# Patient Record
Sex: Female | Born: 1935
Health system: Southern US, Community
[De-identification: ages and names within clinical notes are randomized; demographics above are authoritative.]

## PROBLEM LIST (undated history)

## (undated) DIAGNOSIS — I10 Essential (primary) hypertension: Secondary | ICD-10-CM

## (undated) DIAGNOSIS — E119 Type 2 diabetes mellitus without complications: Secondary | ICD-10-CM

## (undated) HISTORY — DX: Essential (primary) hypertension: I10

## (undated) HISTORY — DX: Type 2 diabetes mellitus without complications: E11.9

---

## 2013-07-22 DIAGNOSIS — H6500 Acute serous otitis media, unspecified ear: Secondary | ICD-10-CM | POA: Diagnosis not present

## 2013-09-13 DIAGNOSIS — H31009 Unspecified chorioretinal scars, unspecified eye: Secondary | ICD-10-CM | POA: Diagnosis not present

## 2013-09-13 DIAGNOSIS — H35379 Puckering of macula, unspecified eye: Secondary | ICD-10-CM | POA: Diagnosis not present

## 2013-09-13 DIAGNOSIS — Z961 Presence of intraocular lens: Secondary | ICD-10-CM | POA: Diagnosis not present

## 2013-09-13 DIAGNOSIS — H26499 Other secondary cataract, unspecified eye: Secondary | ICD-10-CM | POA: Diagnosis not present

## 2013-10-21 DIAGNOSIS — H6500 Acute serous otitis media, unspecified ear: Secondary | ICD-10-CM | POA: Diagnosis not present

## 2013-11-01 DIAGNOSIS — E559 Vitamin D deficiency, unspecified: Secondary | ICD-10-CM | POA: Diagnosis not present

## 2013-11-01 DIAGNOSIS — E782 Mixed hyperlipidemia: Secondary | ICD-10-CM | POA: Diagnosis not present

## 2013-11-01 DIAGNOSIS — G4700 Insomnia, unspecified: Secondary | ICD-10-CM | POA: Diagnosis not present

## 2013-11-01 DIAGNOSIS — I10 Essential (primary) hypertension: Secondary | ICD-10-CM | POA: Diagnosis not present

## 2013-11-01 DIAGNOSIS — E119 Type 2 diabetes mellitus without complications: Secondary | ICD-10-CM | POA: Diagnosis not present

## 2013-11-08 DIAGNOSIS — E782 Mixed hyperlipidemia: Secondary | ICD-10-CM | POA: Diagnosis not present

## 2013-11-08 DIAGNOSIS — E119 Type 2 diabetes mellitus without complications: Secondary | ICD-10-CM | POA: Diagnosis not present

## 2013-11-08 DIAGNOSIS — I10 Essential (primary) hypertension: Secondary | ICD-10-CM | POA: Diagnosis not present

## 2013-11-08 DIAGNOSIS — Z0000 Encounter for general adult medical examination without abnormal findings: Secondary | ICD-10-CM | POA: Diagnosis not present

## 2013-11-08 DIAGNOSIS — IMO0002 Reserved for concepts with insufficient information to code with codable children: Secondary | ICD-10-CM | POA: Diagnosis not present

## 2013-11-08 DIAGNOSIS — R1310 Dysphagia, unspecified: Secondary | ICD-10-CM | POA: Diagnosis not present

## 2013-11-13 DIAGNOSIS — Z1212 Encounter for screening for malignant neoplasm of rectum: Secondary | ICD-10-CM | POA: Diagnosis not present

## 2013-11-19 DIAGNOSIS — M899 Disorder of bone, unspecified: Secondary | ICD-10-CM | POA: Diagnosis not present

## 2013-11-19 DIAGNOSIS — Z1231 Encounter for screening mammogram for malignant neoplasm of breast: Secondary | ICD-10-CM | POA: Diagnosis not present

## 2013-11-19 DIAGNOSIS — M810 Age-related osteoporosis without current pathological fracture: Secondary | ICD-10-CM | POA: Diagnosis not present

## 2013-11-19 DIAGNOSIS — M949 Disorder of cartilage, unspecified: Secondary | ICD-10-CM | POA: Diagnosis not present

## 2013-11-29 DIAGNOSIS — R1310 Dysphagia, unspecified: Secondary | ICD-10-CM | POA: Diagnosis not present

## 2013-11-29 DIAGNOSIS — I10 Essential (primary) hypertension: Secondary | ICD-10-CM | POA: Diagnosis not present

## 2013-11-29 DIAGNOSIS — M810 Age-related osteoporosis without current pathological fracture: Secondary | ICD-10-CM | POA: Diagnosis not present

## 2013-11-29 DIAGNOSIS — IMO0002 Reserved for concepts with insufficient information to code with codable children: Secondary | ICD-10-CM | POA: Diagnosis not present

## 2013-11-29 DIAGNOSIS — E119 Type 2 diabetes mellitus without complications: Secondary | ICD-10-CM | POA: Diagnosis not present

## 2013-11-29 DIAGNOSIS — K219 Gastro-esophageal reflux disease without esophagitis: Secondary | ICD-10-CM | POA: Diagnosis not present

## 2014-07-08 DIAGNOSIS — E118 Type 2 diabetes mellitus with unspecified complications: Secondary | ICD-10-CM | POA: Diagnosis not present

## 2014-07-08 DIAGNOSIS — E559 Vitamin D deficiency, unspecified: Secondary | ICD-10-CM | POA: Diagnosis not present

## 2014-07-08 DIAGNOSIS — E1165 Type 2 diabetes mellitus with hyperglycemia: Secondary | ICD-10-CM | POA: Diagnosis not present

## 2014-07-08 DIAGNOSIS — I159 Secondary hypertension, unspecified: Secondary | ICD-10-CM | POA: Diagnosis not present

## 2014-07-21 DIAGNOSIS — J329 Chronic sinusitis, unspecified: Secondary | ICD-10-CM | POA: Diagnosis not present

## 2014-11-07 DIAGNOSIS — E118 Type 2 diabetes mellitus with unspecified complications: Secondary | ICD-10-CM | POA: Diagnosis not present

## 2014-11-07 DIAGNOSIS — E1165 Type 2 diabetes mellitus with hyperglycemia: Secondary | ICD-10-CM | POA: Diagnosis not present

## 2014-11-14 DIAGNOSIS — E1165 Type 2 diabetes mellitus with hyperglycemia: Secondary | ICD-10-CM | POA: Diagnosis not present

## 2014-11-14 DIAGNOSIS — E785 Hyperlipidemia, unspecified: Secondary | ICD-10-CM | POA: Diagnosis not present

## 2014-11-14 DIAGNOSIS — E559 Vitamin D deficiency, unspecified: Secondary | ICD-10-CM | POA: Diagnosis not present

## 2014-11-14 DIAGNOSIS — E118 Type 2 diabetes mellitus with unspecified complications: Secondary | ICD-10-CM | POA: Diagnosis not present

## 2014-11-14 DIAGNOSIS — I10 Essential (primary) hypertension: Secondary | ICD-10-CM | POA: Diagnosis not present

## 2014-12-25 DIAGNOSIS — E1165 Type 2 diabetes mellitus with hyperglycemia: Secondary | ICD-10-CM | POA: Diagnosis not present

## 2014-12-25 DIAGNOSIS — I10 Essential (primary) hypertension: Secondary | ICD-10-CM | POA: Diagnosis not present

## 2014-12-25 DIAGNOSIS — E118 Type 2 diabetes mellitus with unspecified complications: Secondary | ICD-10-CM | POA: Diagnosis not present

## 2014-12-25 DIAGNOSIS — E559 Vitamin D deficiency, unspecified: Secondary | ICD-10-CM | POA: Diagnosis not present

## 2014-12-25 DIAGNOSIS — E785 Hyperlipidemia, unspecified: Secondary | ICD-10-CM | POA: Diagnosis not present

## 2015-02-10 DIAGNOSIS — H9203 Otalgia, bilateral: Secondary | ICD-10-CM | POA: Diagnosis not present

## 2015-02-16 DIAGNOSIS — E785 Hyperlipidemia, unspecified: Secondary | ICD-10-CM | POA: Diagnosis not present

## 2015-02-16 DIAGNOSIS — E118 Type 2 diabetes mellitus with unspecified complications: Secondary | ICD-10-CM | POA: Diagnosis not present

## 2015-02-16 DIAGNOSIS — I10 Essential (primary) hypertension: Secondary | ICD-10-CM | POA: Diagnosis not present

## 2015-02-16 DIAGNOSIS — E1165 Type 2 diabetes mellitus with hyperglycemia: Secondary | ICD-10-CM | POA: Diagnosis not present

## 2015-02-16 DIAGNOSIS — E559 Vitamin D deficiency, unspecified: Secondary | ICD-10-CM | POA: Diagnosis not present

## 2015-04-26 DIAGNOSIS — Z91041 Radiographic dye allergy status: Secondary | ICD-10-CM | POA: Diagnosis not present

## 2015-04-26 DIAGNOSIS — S72402A Unspecified fracture of lower end of left femur, initial encounter for closed fracture: Secondary | ICD-10-CM | POA: Diagnosis not present

## 2015-04-26 DIAGNOSIS — R1313 Dysphagia, pharyngeal phase: Secondary | ICD-10-CM | POA: Diagnosis not present

## 2015-04-26 DIAGNOSIS — R680 Hypothermia, not associated with low environmental temperature: Secondary | ICD-10-CM | POA: Diagnosis present

## 2015-04-26 DIAGNOSIS — N179 Acute kidney failure, unspecified: Secondary | ICD-10-CM | POA: Diagnosis not present

## 2015-04-26 DIAGNOSIS — E1165 Type 2 diabetes mellitus with hyperglycemia: Secondary | ICD-10-CM | POA: Diagnosis present

## 2015-04-26 DIAGNOSIS — E119 Type 2 diabetes mellitus without complications: Secondary | ICD-10-CM | POA: Diagnosis not present

## 2015-04-26 DIAGNOSIS — Z79899 Other long term (current) drug therapy: Secondary | ICD-10-CM | POA: Diagnosis not present

## 2015-04-26 DIAGNOSIS — I10 Essential (primary) hypertension: Secondary | ICD-10-CM | POA: Diagnosis not present

## 2015-04-26 DIAGNOSIS — L89103 Pressure ulcer of unspecified part of back, stage 3: Secondary | ICD-10-CM | POA: Diagnosis not present

## 2015-04-26 DIAGNOSIS — D62 Acute posthemorrhagic anemia: Secondary | ICD-10-CM | POA: Diagnosis not present

## 2015-04-26 DIAGNOSIS — Z885 Allergy status to narcotic agent status: Secondary | ICD-10-CM | POA: Diagnosis not present

## 2015-04-26 DIAGNOSIS — M25562 Pain in left knee: Secondary | ICD-10-CM | POA: Diagnosis not present

## 2015-04-26 DIAGNOSIS — Z9181 History of falling: Secondary | ICD-10-CM | POA: Diagnosis not present

## 2015-04-26 DIAGNOSIS — Z91048 Other nonmedicinal substance allergy status: Secondary | ICD-10-CM | POA: Diagnosis not present

## 2015-04-26 DIAGNOSIS — R7989 Other specified abnormal findings of blood chemistry: Secondary | ICD-10-CM | POA: Diagnosis present

## 2015-04-26 DIAGNOSIS — D649 Anemia, unspecified: Secondary | ICD-10-CM | POA: Diagnosis not present

## 2015-04-26 DIAGNOSIS — Z7982 Long term (current) use of aspirin: Secondary | ICD-10-CM | POA: Diagnosis not present

## 2015-04-26 DIAGNOSIS — M6281 Muscle weakness (generalized): Secondary | ICD-10-CM | POA: Diagnosis not present

## 2015-04-26 DIAGNOSIS — S7290XA Unspecified fracture of unspecified femur, initial encounter for closed fracture: Secondary | ICD-10-CM | POA: Insufficient documentation

## 2015-04-26 DIAGNOSIS — S52301D Unspecified fracture of shaft of right radius, subsequent encounter for closed fracture with routine healing: Secondary | ICD-10-CM | POA: Diagnosis not present

## 2015-04-26 DIAGNOSIS — E782 Mixed hyperlipidemia: Secondary | ICD-10-CM | POA: Diagnosis present

## 2015-04-26 DIAGNOSIS — D72829 Elevated white blood cell count, unspecified: Secondary | ICD-10-CM | POA: Diagnosis present

## 2015-04-26 DIAGNOSIS — T796XXA Traumatic ischemia of muscle, initial encounter: Secondary | ICD-10-CM | POA: Diagnosis not present

## 2015-04-26 DIAGNOSIS — W19XXXA Unspecified fall, initial encounter: Secondary | ICD-10-CM | POA: Diagnosis not present

## 2015-04-26 DIAGNOSIS — E860 Dehydration: Secondary | ICD-10-CM | POA: Diagnosis not present

## 2015-04-26 DIAGNOSIS — M25552 Pain in left hip: Secondary | ICD-10-CM | POA: Diagnosis not present

## 2015-04-26 DIAGNOSIS — S72012A Unspecified intracapsular fracture of left femur, initial encounter for closed fracture: Secondary | ICD-10-CM | POA: Diagnosis not present

## 2015-04-26 DIAGNOSIS — R1310 Dysphagia, unspecified: Secondary | ICD-10-CM | POA: Diagnosis not present

## 2015-04-26 DIAGNOSIS — M6282 Rhabdomyolysis: Secondary | ICD-10-CM | POA: Diagnosis not present

## 2015-04-26 DIAGNOSIS — M810 Age-related osteoporosis without current pathological fracture: Secondary | ICD-10-CM | POA: Diagnosis present

## 2015-04-26 DIAGNOSIS — E440 Moderate protein-calorie malnutrition: Secondary | ICD-10-CM | POA: Diagnosis present

## 2015-04-26 DIAGNOSIS — R079 Chest pain, unspecified: Secondary | ICD-10-CM | POA: Diagnosis not present

## 2015-04-26 DIAGNOSIS — S72402D Unspecified fracture of lower end of left femur, subsequent encounter for closed fracture with routine healing: Secondary | ICD-10-CM | POA: Diagnosis not present

## 2015-04-26 DIAGNOSIS — Z6821 Body mass index (BMI) 21.0-21.9, adult: Secondary | ICD-10-CM | POA: Diagnosis not present

## 2015-04-26 DIAGNOSIS — L89210 Pressure ulcer of right hip, unstageable: Secondary | ICD-10-CM | POA: Diagnosis not present

## 2015-04-26 DIAGNOSIS — S72452A Displaced supracondylar fracture without intracondylar extension of lower end of left femur, initial encounter for closed fracture: Secondary | ICD-10-CM | POA: Diagnosis not present

## 2015-04-26 DIAGNOSIS — Z791 Long term (current) use of non-steroidal anti-inflammatories (NSAID): Secondary | ICD-10-CM | POA: Diagnosis not present

## 2015-04-26 DIAGNOSIS — N171 Acute kidney failure with acute cortical necrosis: Secondary | ICD-10-CM | POA: Diagnosis not present

## 2015-04-26 DIAGNOSIS — R51 Headache: Secondary | ICD-10-CM | POA: Diagnosis not present

## 2015-04-26 DIAGNOSIS — T796XXD Traumatic ischemia of muscle, subsequent encounter: Secondary | ICD-10-CM | POA: Diagnosis not present

## 2015-04-26 DIAGNOSIS — T148 Other injury of unspecified body region: Secondary | ICD-10-CM | POA: Diagnosis not present

## 2015-04-26 DIAGNOSIS — E785 Hyperlipidemia, unspecified: Secondary | ICD-10-CM | POA: Diagnosis not present

## 2015-04-28 DIAGNOSIS — E782 Mixed hyperlipidemia: Secondary | ICD-10-CM | POA: Insufficient documentation

## 2015-04-28 DIAGNOSIS — I10 Essential (primary) hypertension: Secondary | ICD-10-CM | POA: Insufficient documentation

## 2015-04-29 DIAGNOSIS — D62 Acute posthemorrhagic anemia: Secondary | ICD-10-CM | POA: Insufficient documentation

## 2015-04-30 DIAGNOSIS — IMO0001 Reserved for inherently not codable concepts without codable children: Secondary | ICD-10-CM | POA: Insufficient documentation

## 2015-05-01 DIAGNOSIS — S72402D Unspecified fracture of lower end of left femur, subsequent encounter for closed fracture with routine healing: Secondary | ICD-10-CM | POA: Diagnosis not present

## 2015-05-01 DIAGNOSIS — E785 Hyperlipidemia, unspecified: Secondary | ICD-10-CM | POA: Diagnosis not present

## 2015-05-01 DIAGNOSIS — S72462D Displaced supracondylar fracture with intracondylar extension of lower end of left femur, subsequent encounter for closed fracture with routine healing: Secondary | ICD-10-CM | POA: Diagnosis not present

## 2015-05-01 DIAGNOSIS — F329 Major depressive disorder, single episode, unspecified: Secondary | ICD-10-CM | POA: Diagnosis not present

## 2015-05-01 DIAGNOSIS — R2689 Other abnormalities of gait and mobility: Secondary | ICD-10-CM | POA: Diagnosis not present

## 2015-05-01 DIAGNOSIS — R1310 Dysphagia, unspecified: Secondary | ICD-10-CM | POA: Diagnosis not present

## 2015-05-01 DIAGNOSIS — M25562 Pain in left knee: Secondary | ICD-10-CM | POA: Diagnosis not present

## 2015-05-01 DIAGNOSIS — M810 Age-related osteoporosis without current pathological fracture: Secondary | ICD-10-CM | POA: Diagnosis not present

## 2015-05-01 DIAGNOSIS — R1319 Other dysphagia: Secondary | ICD-10-CM | POA: Diagnosis not present

## 2015-05-01 DIAGNOSIS — G4709 Other insomnia: Secondary | ICD-10-CM | POA: Diagnosis not present

## 2015-05-01 DIAGNOSIS — I10 Essential (primary) hypertension: Secondary | ICD-10-CM | POA: Diagnosis not present

## 2015-05-01 DIAGNOSIS — S32309D Unspecified fracture of unspecified ilium, subsequent encounter for fracture with routine healing: Secondary | ICD-10-CM | POA: Diagnosis not present

## 2015-05-01 DIAGNOSIS — D649 Anemia, unspecified: Secondary | ICD-10-CM | POA: Diagnosis not present

## 2015-05-01 DIAGNOSIS — E119 Type 2 diabetes mellitus without complications: Secondary | ICD-10-CM | POA: Diagnosis not present

## 2015-05-01 DIAGNOSIS — E46 Unspecified protein-calorie malnutrition: Secondary | ICD-10-CM | POA: Diagnosis not present

## 2015-05-01 DIAGNOSIS — Z9181 History of falling: Secondary | ICD-10-CM | POA: Diagnosis not present

## 2015-05-01 DIAGNOSIS — M6281 Muscle weakness (generalized): Secondary | ICD-10-CM | POA: Diagnosis not present

## 2015-05-01 DIAGNOSIS — L89100 Pressure ulcer of unspecified part of back, unstageable: Secondary | ICD-10-CM | POA: Diagnosis not present

## 2015-05-01 DIAGNOSIS — L89210 Pressure ulcer of right hip, unstageable: Secondary | ICD-10-CM | POA: Diagnosis not present

## 2015-05-01 DIAGNOSIS — L89103 Pressure ulcer of unspecified part of back, stage 3: Secondary | ICD-10-CM | POA: Diagnosis not present

## 2015-05-01 DIAGNOSIS — M79605 Pain in left leg: Secondary | ICD-10-CM | POA: Diagnosis not present

## 2015-05-01 DIAGNOSIS — F4320 Adjustment disorder, unspecified: Secondary | ICD-10-CM | POA: Diagnosis not present

## 2015-05-01 DIAGNOSIS — M62838 Other muscle spasm: Secondary | ICD-10-CM | POA: Diagnosis not present

## 2015-05-01 DIAGNOSIS — L89204 Pressure ulcer of unspecified hip, stage 4: Secondary | ICD-10-CM | POA: Diagnosis not present

## 2015-05-01 DIAGNOSIS — L89214 Pressure ulcer of right hip, stage 4: Secondary | ICD-10-CM | POA: Diagnosis not present

## 2015-05-04 DIAGNOSIS — M6281 Muscle weakness (generalized): Secondary | ICD-10-CM | POA: Diagnosis not present

## 2015-05-04 DIAGNOSIS — E119 Type 2 diabetes mellitus without complications: Secondary | ICD-10-CM | POA: Diagnosis not present

## 2015-05-04 DIAGNOSIS — R1319 Other dysphagia: Secondary | ICD-10-CM | POA: Diagnosis not present

## 2015-05-04 DIAGNOSIS — I10 Essential (primary) hypertension: Secondary | ICD-10-CM | POA: Diagnosis not present

## 2015-05-04 DIAGNOSIS — R2689 Other abnormalities of gait and mobility: Secondary | ICD-10-CM | POA: Diagnosis not present

## 2015-05-04 DIAGNOSIS — M810 Age-related osteoporosis without current pathological fracture: Secondary | ICD-10-CM | POA: Diagnosis not present

## 2015-05-06 DIAGNOSIS — M79605 Pain in left leg: Secondary | ICD-10-CM | POA: Diagnosis not present

## 2015-05-06 DIAGNOSIS — R2689 Other abnormalities of gait and mobility: Secondary | ICD-10-CM | POA: Diagnosis not present

## 2015-05-11 DIAGNOSIS — R2689 Other abnormalities of gait and mobility: Secondary | ICD-10-CM | POA: Diagnosis not present

## 2015-05-12 DIAGNOSIS — S72453A Displaced supracondylar fracture without intracondylar extension of lower end of unspecified femur, initial encounter for closed fracture: Secondary | ICD-10-CM | POA: Insufficient documentation

## 2015-05-15 DIAGNOSIS — M79605 Pain in left leg: Secondary | ICD-10-CM | POA: Diagnosis not present

## 2015-05-15 DIAGNOSIS — M62838 Other muscle spasm: Secondary | ICD-10-CM | POA: Diagnosis not present

## 2015-05-20 DIAGNOSIS — M79605 Pain in left leg: Secondary | ICD-10-CM | POA: Diagnosis not present

## 2015-05-20 DIAGNOSIS — M25562 Pain in left knee: Secondary | ICD-10-CM | POA: Diagnosis not present

## 2015-06-08 DIAGNOSIS — R2689 Other abnormalities of gait and mobility: Secondary | ICD-10-CM | POA: Diagnosis not present

## 2015-06-08 DIAGNOSIS — M79605 Pain in left leg: Secondary | ICD-10-CM | POA: Diagnosis not present

## 2015-06-10 DIAGNOSIS — E119 Type 2 diabetes mellitus without complications: Secondary | ICD-10-CM | POA: Diagnosis not present

## 2015-06-10 DIAGNOSIS — G4709 Other insomnia: Secondary | ICD-10-CM | POA: Diagnosis not present

## 2015-06-10 DIAGNOSIS — F329 Major depressive disorder, single episode, unspecified: Secondary | ICD-10-CM | POA: Diagnosis not present

## 2015-06-10 DIAGNOSIS — E46 Unspecified protein-calorie malnutrition: Secondary | ICD-10-CM | POA: Diagnosis not present

## 2015-07-06 LAB — CBC AND DIFFERENTIAL
HEMATOCRIT: 34 % — AB (ref 36–46)
HEMOGLOBIN: 10.7 g/dL — AB (ref 12.0–16.0)

## 2015-07-06 LAB — BASIC METABOLIC PANEL: Creatinine: 0.6 mg/dL (ref 0.5–1.1)

## 2015-07-06 LAB — CBC, NO DIFFERENTIAL/PLATELET: MCV: 89.2

## 2015-07-19 DIAGNOSIS — E785 Hyperlipidemia, unspecified: Secondary | ICD-10-CM | POA: Diagnosis not present

## 2015-07-19 DIAGNOSIS — I10 Essential (primary) hypertension: Secondary | ICD-10-CM | POA: Diagnosis not present

## 2015-07-19 DIAGNOSIS — S72462D Displaced supracondylar fracture with intracondylar extension of lower end of left femur, subsequent encounter for closed fracture with routine healing: Secondary | ICD-10-CM | POA: Diagnosis not present

## 2015-07-19 DIAGNOSIS — S72402D Unspecified fracture of lower end of left femur, subsequent encounter for closed fracture with routine healing: Secondary | ICD-10-CM | POA: Diagnosis not present

## 2015-07-19 DIAGNOSIS — R1310 Dysphagia, unspecified: Secondary | ICD-10-CM | POA: Diagnosis not present

## 2015-07-19 DIAGNOSIS — D649 Anemia, unspecified: Secondary | ICD-10-CM | POA: Diagnosis not present

## 2015-07-19 DIAGNOSIS — Z9181 History of falling: Secondary | ICD-10-CM | POA: Diagnosis not present

## 2015-07-19 DIAGNOSIS — L89214 Pressure ulcer of right hip, stage 4: Secondary | ICD-10-CM | POA: Diagnosis not present

## 2015-07-19 DIAGNOSIS — E119 Type 2 diabetes mellitus without complications: Secondary | ICD-10-CM | POA: Diagnosis not present

## 2015-07-19 DIAGNOSIS — L89103 Pressure ulcer of unspecified part of back, stage 3: Secondary | ICD-10-CM | POA: Diagnosis not present

## 2015-07-19 DIAGNOSIS — L89100 Pressure ulcer of unspecified part of back, unstageable: Secondary | ICD-10-CM | POA: Diagnosis not present

## 2015-07-19 DIAGNOSIS — L89210 Pressure ulcer of right hip, unstageable: Secondary | ICD-10-CM | POA: Diagnosis not present

## 2015-07-19 DIAGNOSIS — M810 Age-related osteoporosis without current pathological fracture: Secondary | ICD-10-CM | POA: Diagnosis not present

## 2015-07-21 DIAGNOSIS — S72462D Displaced supracondylar fracture with intracondylar extension of lower end of left femur, subsequent encounter for closed fracture with routine healing: Secondary | ICD-10-CM | POA: Diagnosis not present

## 2015-07-21 DIAGNOSIS — S72402D Unspecified fracture of lower end of left femur, subsequent encounter for closed fracture with routine healing: Secondary | ICD-10-CM | POA: Diagnosis not present

## 2015-08-10 DIAGNOSIS — L89214 Pressure ulcer of right hip, stage 4: Secondary | ICD-10-CM | POA: Diagnosis not present

## 2015-08-10 DIAGNOSIS — F329 Major depressive disorder, single episode, unspecified: Secondary | ICD-10-CM | POA: Diagnosis not present

## 2015-08-10 DIAGNOSIS — K219 Gastro-esophageal reflux disease without esophagitis: Secondary | ICD-10-CM | POA: Diagnosis not present

## 2015-08-10 DIAGNOSIS — I10 Essential (primary) hypertension: Secondary | ICD-10-CM | POA: Diagnosis not present

## 2015-08-10 DIAGNOSIS — S72462D Displaced supracondylar fracture with intracondylar extension of lower end of left femur, subsequent encounter for closed fracture with routine healing: Secondary | ICD-10-CM | POA: Diagnosis not present

## 2015-08-10 DIAGNOSIS — D649 Anemia, unspecified: Secondary | ICD-10-CM | POA: Diagnosis not present

## 2015-08-10 DIAGNOSIS — E119 Type 2 diabetes mellitus without complications: Secondary | ICD-10-CM | POA: Diagnosis not present

## 2015-08-10 DIAGNOSIS — E782 Mixed hyperlipidemia: Secondary | ICD-10-CM | POA: Diagnosis not present

## 2015-08-10 DIAGNOSIS — Z7984 Long term (current) use of oral hypoglycemic drugs: Secondary | ICD-10-CM | POA: Diagnosis not present

## 2015-08-11 ENCOUNTER — Encounter: Payer: Self-pay | Admitting: Family Medicine

## 2015-08-11 ENCOUNTER — Ambulatory Visit (INDEPENDENT_AMBULATORY_CARE_PROVIDER_SITE_OTHER): Payer: Medicare Other | Admitting: Family Medicine

## 2015-08-11 VITALS — BP 141/80 | HR 91 | Ht 60.0 in | Wt 118.0 lb

## 2015-08-11 DIAGNOSIS — K219 Gastro-esophageal reflux disease without esophagitis: Secondary | ICD-10-CM | POA: Diagnosis not present

## 2015-08-11 DIAGNOSIS — S72452A Displaced supracondylar fracture without intracondylar extension of lower end of left femur, initial encounter for closed fracture: Secondary | ICD-10-CM | POA: Diagnosis not present

## 2015-08-11 DIAGNOSIS — Z794 Long term (current) use of insulin: Secondary | ICD-10-CM

## 2015-08-11 DIAGNOSIS — M949 Disorder of cartilage, unspecified: Secondary | ICD-10-CM

## 2015-08-11 DIAGNOSIS — I10 Essential (primary) hypertension: Secondary | ICD-10-CM | POA: Diagnosis not present

## 2015-08-11 DIAGNOSIS — M899 Disorder of bone, unspecified: Secondary | ICD-10-CM

## 2015-08-11 DIAGNOSIS — S72462D Displaced supracondylar fracture with intracondylar extension of lower end of left femur, subsequent encounter for closed fracture with routine healing: Secondary | ICD-10-CM | POA: Diagnosis not present

## 2015-08-11 DIAGNOSIS — D62 Acute posthemorrhagic anemia: Secondary | ICD-10-CM

## 2015-08-11 DIAGNOSIS — E119 Type 2 diabetes mellitus without complications: Secondary | ICD-10-CM

## 2015-08-11 DIAGNOSIS — W19XXXA Unspecified fall, initial encounter: Secondary | ICD-10-CM

## 2015-08-11 DIAGNOSIS — D649 Anemia, unspecified: Secondary | ICD-10-CM | POA: Diagnosis not present

## 2015-08-11 DIAGNOSIS — L89214 Pressure ulcer of right hip, stage 4: Secondary | ICD-10-CM | POA: Diagnosis not present

## 2015-08-11 DIAGNOSIS — E1169 Type 2 diabetes mellitus with other specified complication: Secondary | ICD-10-CM | POA: Insufficient documentation

## 2015-08-11 MED ORDER — ESOMEPRAZOLE MAGNESIUM 20 MG PO CPDR: 20.0000 mg | DELAYED_RELEASE_CAPSULE | Freq: Every day | ORAL | Status: DC

## 2015-08-11 MED ORDER — AMLODIPINE BESYLATE 5 MG PO TABS: 5.0000 mg | ORAL_TABLET | Freq: Every day | ORAL | Status: DC

## 2015-08-11 MED ORDER — VITAMIN D3 50 MCG (2000 UT) PO TABS: 1.0000 | ORAL_TABLET | Freq: Every day | ORAL | Status: AC

## 2015-08-11 MED ORDER — LISINOPRIL 20 MG PO TABS: 20.0000 mg | ORAL_TABLET | Freq: Every day | ORAL | Status: DC

## 2015-08-11 NOTE — Assessment & Plan Note (Signed)
Check CBC TIBC and ferritin

## 2015-08-11 NOTE — Assessment & Plan Note (Addendum)
Well controlled. Continue current regimen. Will get bloodwork.

## 2015-08-11 NOTE — Progress Notes (Signed)
       Darlene Lynn is a 82 y.o. female who presents to [facility] today for establish care.  Fall/femur fx: Patient had a fall with distal L femur Fx back in October 2016 and spent the last 100 days in the hospital and rehabilitation facility. SHe was discharged 2 days ago on Sunday and presents today to establish PCP for follow-up. Patient has recovered well, requiring no pain medications at this time. Patient will be seen by PT/OT and wound vac home health. They will be optimizing her home environment. She will see the wound clinic tomorrow for a R hip pressure ulcer that is well healing per the patient. Patient otherwise has no complaints. Patient prescribed Hydroxyzine but does not take. Patient has no rugs in house, no stairs. Vision checked 3 years ago.   DM: Blood sugar under control per patient, last A1C in October 6.6. Patient takes Metformin daily.   HTN: well controlle don current regimen, patient states usually runs in 120s SBP.   Dysphagia: has some reflux symptoms in the AM.   HM: Has had pneumonia vaccine, interested in shingles vaccine next visit, unknown TDap history.    Past Medical History  Diagnosis Date  . Diabetes mellitus without complication ([place])   . Hypertension    History reviewed. No pertinent past surgical history. Social History  Substance Use Topics  . Smoking status: Never Smoker   . Smokeless tobacco: Not on file  . Alcohol Use: No   family history includes Diabetes in her brother, father, and sister; Hypertension in her father, mother, and sister.  ROS as above Medications: Current Outpatient Prescriptions  Medication Sig Dispense Refill  . amLODipine (NORVASC) 5 MG tablet Take 1 tablet (5 mg total) by mouth daily. 90 tablet 0  . atorvastatin (LIPITOR) 40 MG tablet Take 40 mg by mouth daily.    . Cholecalciferol (VITAMIN D3) 2000 units TABS Take  1 tablet by mouth daily. 90 tablet 1  . esomeprazole (NEXIUM) 20 MG capsule Take 1 capsule (20 mg total) by mouth daily. 90 capsule 1  . lisinopril (PRINIVIL,ZESTRIL) 20 MG tablet Take 1 tablet (20 mg total) by mouth daily. 90 tablet 0  . metFORMIN (GLUCOPHAGE) 1000 MG tablet TAKE 1 TABLET 2 TIMES DAILY     No current facility-administered medications for this visit.   Allergies  Allergen Reactions  . Iodinated Diagnostic Agents Hives and Itching  . Iodine Itching and Rash  . Codeine Nausea And Vomiting  . Tape Dermatitis     Exam:  BP 141/80 mmHg  Pulse 91  Ht 5' (1.524 m)  Wt 118 lb (53.524 kg)  BMI 23.05 kg/m2 Gen: Elderly, well-appearing woman in NAD walking in with walker. Patient is spry-appearing. HEENT: EOMI,  MMM Lungs: Normal work of breathing. CTABL no wheezes or crackles.  Heart: RRR no MRG Abd: NABS, Soft. Nondistended, Nontender Exts: Brisk capillary refill, warm and well perfused.  Skin: exam of pressure ulcer deferred by patient  Diabetic foot exam monofilament testing normal in bilateral feet Psych: Alert and oriented normal affect speech and thought process.  No results found for this or any previous visit (from the past 24 hour(s)). No results found.  Ordered baseline screening labs   Please see individual assessment and plan sections.

## 2015-08-11 NOTE — Assessment & Plan Note (Signed)
Will order DEXA next visit.

## 2015-08-11 NOTE — Assessment & Plan Note (Signed)
Check A1c and CMP. Instructed patient to see ophto for eye exam. Will get urine microalbumin next visit. Follow up 1 month.

## 2015-08-11 NOTE — Patient Instructions (Signed)
Thank you for coming in today. You were seen to establish care. Please stp taking Hydroxyzine as it can make you more likely to fall. Please see an eye doctor for a Diabetes eye exam. We will get some basic labs to check you out. Please return to get those drawn when you have nothing to eat or drink but water after midnight. Please come back in 1 month for follow up.

## 2015-08-11 NOTE — Assessment & Plan Note (Signed)
Vitamin D. Continue vitamin D. Follow-up with orthopedics. Consider screening for osteoporosis with DEXA scan.

## 2015-08-11 NOTE — Assessment & Plan Note (Signed)
Stopped hydroxyzine. OT to optimize home environment. Will plan DEXA scan next visit.

## 2015-08-12 DIAGNOSIS — S72462D Displaced supracondylar fracture with intracondylar extension of lower end of left femur, subsequent encounter for closed fracture with routine healing: Secondary | ICD-10-CM | POA: Diagnosis not present

## 2015-08-12 DIAGNOSIS — E119 Type 2 diabetes mellitus without complications: Secondary | ICD-10-CM | POA: Diagnosis not present

## 2015-08-12 DIAGNOSIS — K219 Gastro-esophageal reflux disease without esophagitis: Secondary | ICD-10-CM | POA: Diagnosis not present

## 2015-08-12 DIAGNOSIS — D649 Anemia, unspecified: Secondary | ICD-10-CM | POA: Diagnosis not present

## 2015-08-12 DIAGNOSIS — L89214 Pressure ulcer of right hip, stage 4: Secondary | ICD-10-CM | POA: Diagnosis not present

## 2015-08-12 DIAGNOSIS — I10 Essential (primary) hypertension: Secondary | ICD-10-CM | POA: Diagnosis not present

## 2015-08-13 ENCOUNTER — Telehealth: Payer: Self-pay

## 2015-08-13 ENCOUNTER — Encounter: Payer: Self-pay | Admitting: Family Medicine

## 2015-08-13 DIAGNOSIS — K219 Gastro-esophageal reflux disease without esophagitis: Secondary | ICD-10-CM | POA: Diagnosis not present

## 2015-08-13 DIAGNOSIS — L89214 Pressure ulcer of right hip, stage 4: Secondary | ICD-10-CM | POA: Diagnosis not present

## 2015-08-13 DIAGNOSIS — E119 Type 2 diabetes mellitus without complications: Secondary | ICD-10-CM | POA: Diagnosis not present

## 2015-08-13 DIAGNOSIS — D649 Anemia, unspecified: Secondary | ICD-10-CM | POA: Diagnosis not present

## 2015-08-13 DIAGNOSIS — I10 Essential (primary) hypertension: Secondary | ICD-10-CM | POA: Diagnosis not present

## 2015-08-13 DIAGNOSIS — S72462D Displaced supracondylar fracture with intracondylar extension of lower end of left femur, subsequent encounter for closed fracture with routine healing: Secondary | ICD-10-CM | POA: Diagnosis not present

## 2015-08-13 MED ORDER — DECUBI-VITE PO CAPS: 1.0000 | ORAL_CAPSULE | Freq: Every day | ORAL | Status: DC

## 2015-08-13 NOTE — Telephone Encounter (Signed)
Darlene Lynn called and would like a refill on Decubi-Vite. She takes it for her wound on her hip.

## 2015-08-13 NOTE — Telephone Encounter (Signed)
Pt.notified

## 2015-08-13 NOTE — Telephone Encounter (Signed)
Done

## 2015-08-14 ENCOUNTER — Encounter: Payer: Self-pay | Admitting: Physician Assistant

## 2015-08-14 ENCOUNTER — Ambulatory Visit (INDEPENDENT_AMBULATORY_CARE_PROVIDER_SITE_OTHER): Payer: Medicare Other | Admitting: Physician Assistant

## 2015-08-14 ENCOUNTER — Ambulatory Visit (HOSPITAL_BASED_OUTPATIENT_CLINIC_OR_DEPARTMENT_OTHER)
Admission: RE | Admit: 2015-08-14 | Discharge: 2015-08-14 | Disposition: A | Discharge status: Home / Self Care | Payer: Medicare Other | Source: Ambulatory Visit | Attending: Physician Assistant | Admitting: Physician Assistant

## 2015-08-14 VITALS — BP 127/55 | HR 97 | Ht 60.0 in

## 2015-08-14 DIAGNOSIS — L89214 Pressure ulcer of right hip, stage 4: Secondary | ICD-10-CM | POA: Diagnosis not present

## 2015-08-14 DIAGNOSIS — M79672 Pain in left foot: Secondary | ICD-10-CM | POA: Diagnosis not present

## 2015-08-14 DIAGNOSIS — I82890 Acute embolism and thrombosis of other specified veins: Secondary | ICD-10-CM | POA: Diagnosis not present

## 2015-08-14 DIAGNOSIS — S72463G Displaced supracondylar fracture with intracondylar extension of lower end of unspecified femur, subsequent encounter for closed fracture with delayed healing: Secondary | ICD-10-CM | POA: Insufficient documentation

## 2015-08-14 DIAGNOSIS — I82459 Acute embolism and thrombosis of unspecified peroneal vein: Secondary | ICD-10-CM | POA: Insufficient documentation

## 2015-08-14 DIAGNOSIS — L8990 Pressure ulcer of unspecified site, unspecified stage: Secondary | ICD-10-CM

## 2015-08-14 DIAGNOSIS — M25472 Effusion, left ankle: Secondary | ICD-10-CM | POA: Insufficient documentation

## 2015-08-14 DIAGNOSIS — I82452 Acute embolism and thrombosis of left peroneal vein: Secondary | ICD-10-CM

## 2015-08-14 DIAGNOSIS — D649 Anemia, unspecified: Secondary | ICD-10-CM | POA: Diagnosis not present

## 2015-08-14 DIAGNOSIS — I10 Essential (primary) hypertension: Secondary | ICD-10-CM | POA: Diagnosis not present

## 2015-08-14 DIAGNOSIS — E119 Type 2 diabetes mellitus without complications: Secondary | ICD-10-CM | POA: Diagnosis not present

## 2015-08-14 DIAGNOSIS — K219 Gastro-esophageal reflux disease without esophagitis: Secondary | ICD-10-CM | POA: Diagnosis not present

## 2015-08-14 DIAGNOSIS — S72462D Displaced supracondylar fracture with intracondylar extension of lower end of left femur, subsequent encounter for closed fracture with routine healing: Secondary | ICD-10-CM | POA: Diagnosis not present

## 2015-08-14 DIAGNOSIS — S72463D Displaced supracondylar fracture with intracondylar extension of lower end of unspecified femur, subsequent encounter for closed fracture with routine healing: Secondary | ICD-10-CM

## 2015-08-14 DIAGNOSIS — M7989 Other specified soft tissue disorders: Secondary | ICD-10-CM | POA: Diagnosis not present

## 2015-08-14 DIAGNOSIS — I82819 Embolism and thrombosis of superficial veins of unspecified lower extremities: Secondary | ICD-10-CM | POA: Diagnosis not present

## 2015-08-14 DIAGNOSIS — I82492 Acute embolism and thrombosis of other specified deep vein of left lower extremity: Secondary | ICD-10-CM

## 2015-08-14 MED ORDER — HYDROCODONE-ACETAMINOPHEN 5-325 MG PO TABS: 1.0000 | ORAL_TABLET | Freq: Three times a day (TID) | ORAL | Status: DC | PRN

## 2015-08-14 MED ORDER — DOXYCYCLINE HYCLATE 100 MG PO TABS: 100.0000 mg | ORAL_TABLET | Freq: Two times a day (BID) | ORAL | Status: DC

## 2015-08-14 MED ORDER — DECUBI-VITE PO CAPS: 1.0000 | ORAL_CAPSULE | Freq: Every day | ORAL | Status: AC

## 2015-08-14 MED ORDER — APIXABAN 5 MG PO TABS: ORAL_TABLET | ORAL | Status: DC

## 2015-08-14 NOTE — Progress Notes (Addendum)
   Subjective:    Patient ID: Darlene Lynn, female    DOB: 06-Jan-1934, 82 y.o.   MRN: [id number]  HPI   patient is a 82 year old female who presents to the clinic with her grandson to discuss left foot pain and left foot and ankle swelling. She's had this pain for the last 4 days. The swelling  Seems to be increasing. She is only been out of her rehab facility for a few weeks after 100 days after a closed displaced fracture of her femur in October. Her grandson stated she just got out of the cast a few weeks ago. Her pain is 4 out of 10. It seems to be worse at night. She denies any recent injury or fall. She denies any shortness of breath or wheezing.   Patient reports the home health was supposed to call and let us know about some redness over her decubitus ulcer on her right hip. This can about after she was laying in the floor for 6 days without anyone finding her after her fall.   She would also like refill on her vitamins.  Review of Systems  All other systems reviewed and are negative.      Objective:   Physical Exam  Constitutional: She is oriented to person, place, and time. She appears well-developed and well-nourished.  HENT:  Head: Normocephalic and atraumatic.  Cardiovascular: Normal rate, regular rhythm and normal heart sounds.   Pulmonary/Chest: Effort normal and breath sounds normal. She has no wheezes.  Musculoskeletal:  Left foot:  Swollen over forefoot and medial ankle. 1+ pitting edema.Tender to palpation. Warm to touch. No laceration. Or injury.   Neurological: She is alert and oriented to person, place, and time.  Skin: Skin is dry.  Psychiatric: She has a normal mood and affect. Her behavior is normal.          Assessment & Plan:  Left foot pain and swelling- due to previous hospital stay and fracture of same leg have to be concerned for DVT. Sent to [place] for STAT image. Will call grandson cooper with results. at [phone]. Sent small quanity of  norco to use only at bedtime for pain since worse at night. elliqus card given if need to use anti-coagulant. If no DVT consider ACE wrap, elevated, ibuprofen, and ice. No trauma to suggest fracture.   [place] confirms DVT of peroneal vein. Sent in [place] to start. Follow up with PCP in 1 week.   Decubitus ulcer- treated with doxycycline for 10 days. Home health will continue to evaluate and report back to office findings.  MVI sent to pharmacy.

## 2015-08-14 NOTE — Addendum Note (Signed)
Addended by: Donella Stade on: 08/14/2015 09:31 PM   Modules accepted: Orders

## 2015-08-15 DIAGNOSIS — K219 Gastro-esophageal reflux disease without esophagitis: Secondary | ICD-10-CM | POA: Diagnosis not present

## 2015-08-15 DIAGNOSIS — I10 Essential (primary) hypertension: Secondary | ICD-10-CM | POA: Diagnosis not present

## 2015-08-15 DIAGNOSIS — E119 Type 2 diabetes mellitus without complications: Secondary | ICD-10-CM | POA: Diagnosis not present

## 2015-08-15 DIAGNOSIS — D649 Anemia, unspecified: Secondary | ICD-10-CM | POA: Diagnosis not present

## 2015-08-15 DIAGNOSIS — L89214 Pressure ulcer of right hip, stage 4: Secondary | ICD-10-CM | POA: Diagnosis not present

## 2015-08-15 DIAGNOSIS — S72462D Displaced supracondylar fracture with intracondylar extension of lower end of left femur, subsequent encounter for closed fracture with routine healing: Secondary | ICD-10-CM | POA: Diagnosis not present

## 2015-08-17 DIAGNOSIS — D649 Anemia, unspecified: Secondary | ICD-10-CM | POA: Diagnosis not present

## 2015-08-17 DIAGNOSIS — K219 Gastro-esophageal reflux disease without esophagitis: Secondary | ICD-10-CM | POA: Diagnosis not present

## 2015-08-17 DIAGNOSIS — L89214 Pressure ulcer of right hip, stage 4: Secondary | ICD-10-CM | POA: Diagnosis not present

## 2015-08-17 DIAGNOSIS — S72462D Displaced supracondylar fracture with intracondylar extension of lower end of left femur, subsequent encounter for closed fracture with routine healing: Secondary | ICD-10-CM | POA: Diagnosis not present

## 2015-08-17 DIAGNOSIS — E119 Type 2 diabetes mellitus without complications: Secondary | ICD-10-CM | POA: Diagnosis not present

## 2015-08-17 DIAGNOSIS — I10 Essential (primary) hypertension: Secondary | ICD-10-CM | POA: Diagnosis not present

## 2015-08-18 DIAGNOSIS — K219 Gastro-esophageal reflux disease without esophagitis: Secondary | ICD-10-CM | POA: Diagnosis not present

## 2015-08-18 DIAGNOSIS — L89214 Pressure ulcer of right hip, stage 4: Secondary | ICD-10-CM | POA: Diagnosis not present

## 2015-08-18 DIAGNOSIS — S72462D Displaced supracondylar fracture with intracondylar extension of lower end of left femur, subsequent encounter for closed fracture with routine healing: Secondary | ICD-10-CM | POA: Diagnosis not present

## 2015-08-18 DIAGNOSIS — I10 Essential (primary) hypertension: Secondary | ICD-10-CM | POA: Diagnosis not present

## 2015-08-18 DIAGNOSIS — D649 Anemia, unspecified: Secondary | ICD-10-CM | POA: Diagnosis not present

## 2015-08-18 DIAGNOSIS — E119 Type 2 diabetes mellitus without complications: Secondary | ICD-10-CM | POA: Diagnosis not present

## 2015-08-19 DIAGNOSIS — L89214 Pressure ulcer of right hip, stage 4: Secondary | ICD-10-CM | POA: Diagnosis not present

## 2015-08-19 DIAGNOSIS — I10 Essential (primary) hypertension: Secondary | ICD-10-CM | POA: Diagnosis not present

## 2015-08-19 DIAGNOSIS — L89210 Pressure ulcer of right hip, unstageable: Secondary | ICD-10-CM | POA: Diagnosis not present

## 2015-08-19 DIAGNOSIS — K219 Gastro-esophageal reflux disease without esophagitis: Secondary | ICD-10-CM | POA: Diagnosis not present

## 2015-08-19 DIAGNOSIS — M810 Age-related osteoporosis without current pathological fracture: Secondary | ICD-10-CM | POA: Diagnosis not present

## 2015-08-19 DIAGNOSIS — D649 Anemia, unspecified: Secondary | ICD-10-CM | POA: Diagnosis not present

## 2015-08-19 DIAGNOSIS — S72462D Displaced supracondylar fracture with intracondylar extension of lower end of left femur, subsequent encounter for closed fracture with routine healing: Secondary | ICD-10-CM | POA: Diagnosis not present

## 2015-08-19 DIAGNOSIS — E119 Type 2 diabetes mellitus without complications: Secondary | ICD-10-CM | POA: Diagnosis not present

## 2015-08-20 DIAGNOSIS — L89214 Pressure ulcer of right hip, stage 4: Secondary | ICD-10-CM | POA: Diagnosis not present

## 2015-08-20 DIAGNOSIS — S72462D Displaced supracondylar fracture with intracondylar extension of lower end of left femur, subsequent encounter for closed fracture with routine healing: Secondary | ICD-10-CM | POA: Diagnosis not present

## 2015-08-20 DIAGNOSIS — K219 Gastro-esophageal reflux disease without esophagitis: Secondary | ICD-10-CM | POA: Diagnosis not present

## 2015-08-20 DIAGNOSIS — I10 Essential (primary) hypertension: Secondary | ICD-10-CM | POA: Diagnosis not present

## 2015-08-20 DIAGNOSIS — D649 Anemia, unspecified: Secondary | ICD-10-CM | POA: Diagnosis not present

## 2015-08-20 DIAGNOSIS — E119 Type 2 diabetes mellitus without complications: Secondary | ICD-10-CM | POA: Diagnosis not present

## 2015-08-21 ENCOUNTER — Ambulatory Visit (INDEPENDENT_AMBULATORY_CARE_PROVIDER_SITE_OTHER): Payer: Medicare Other | Admitting: Family Medicine

## 2015-08-21 ENCOUNTER — Encounter: Payer: Self-pay | Admitting: Family Medicine

## 2015-08-21 VITALS — BP 90/51 | HR 106 | Wt 112.0 lb

## 2015-08-21 DIAGNOSIS — I82452 Acute embolism and thrombosis of left peroneal vein: Secondary | ICD-10-CM

## 2015-08-21 DIAGNOSIS — E119 Type 2 diabetes mellitus without complications: Secondary | ICD-10-CM | POA: Diagnosis not present

## 2015-08-21 DIAGNOSIS — D649 Anemia, unspecified: Secondary | ICD-10-CM | POA: Diagnosis not present

## 2015-08-21 DIAGNOSIS — M949 Disorder of cartilage, unspecified: Secondary | ICD-10-CM | POA: Diagnosis not present

## 2015-08-21 DIAGNOSIS — I82492 Acute embolism and thrombosis of other specified deep vein of left lower extremity: Secondary | ICD-10-CM

## 2015-08-21 DIAGNOSIS — I10 Essential (primary) hypertension: Secondary | ICD-10-CM

## 2015-08-21 DIAGNOSIS — L89214 Pressure ulcer of right hip, stage 4: Secondary | ICD-10-CM | POA: Diagnosis not present

## 2015-08-21 DIAGNOSIS — M899 Disorder of bone, unspecified: Secondary | ICD-10-CM

## 2015-08-21 DIAGNOSIS — D62 Acute posthemorrhagic anemia: Secondary | ICD-10-CM

## 2015-08-21 DIAGNOSIS — Z794 Long term (current) use of insulin: Secondary | ICD-10-CM

## 2015-08-21 DIAGNOSIS — K219 Gastro-esophageal reflux disease without esophagitis: Secondary | ICD-10-CM | POA: Diagnosis not present

## 2015-08-21 DIAGNOSIS — S72462D Displaced supracondylar fracture with intracondylar extension of lower end of left femur, subsequent encounter for closed fracture with routine healing: Secondary | ICD-10-CM | POA: Diagnosis not present

## 2015-08-21 LAB — COMPREHENSIVE METABOLIC PANEL
ALK PHOS: 205 U/L — AB (ref 33–130)
ALT: 32 U/L — AB (ref 6–29)
AST: 27 U/L (ref 10–35)
Albumin: 3.9 g/dL (ref 3.6–5.1)
BUN: 31 mg/dL — AB (ref 7–25)
CALCIUM: 10.8 mg/dL — AB (ref 8.6–10.4)
CO2: 22 mmol/L (ref 20–31)
Chloride: 100 mmol/L (ref 98–110)
Creat: 1.05 mg/dL — ABNORMAL HIGH (ref 0.60–0.93)
GLUCOSE: 101 mg/dL — AB (ref 65–99)
POTASSIUM: 4.3 mmol/L (ref 3.5–5.3)
Sodium: 136 mmol/L (ref 135–146)
Total Bilirubin: 0.8 mg/dL (ref 0.2–1.2)
Total Protein: 7.2 g/dL (ref 6.1–8.1)

## 2015-08-21 LAB — CBC
HCT: 40.4 % (ref 36.0–46.0)
Hemoglobin: 13.4 g/dL (ref 12.0–15.0)
MCH: 29.1 pg (ref 26.0–34.0)
MCHC: 33.2 g/dL (ref 30.0–36.0)
MCV: 87.6 fL (ref 78.0–100.0)
MPV: 10.5 fL (ref 8.6–12.4)
PLATELETS: 475 10*3/uL — AB (ref 150–400)
RBC: 4.61 MIL/uL (ref 3.87–5.11)
RDW: 13.5 % (ref 11.5–15.5)
WBC: 10.2 10*3/uL (ref 4.0–10.5)

## 2015-08-21 LAB — IRON AND TIBC
%SAT: 21 % (ref 11–50)
IRON: 64 ug/dL (ref 45–160)
TIBC: 302 ug/dL (ref 250–450)
UIBC: 238 ug/dL (ref 125–400)

## 2015-08-21 LAB — HEMOGLOBIN A1C
Hgb A1c MFr Bld: 6.6 % — ABNORMAL HIGH (ref <5.7)
Mean Plasma Glucose: 143 mg/dL — ABNORMAL HIGH (ref <117)

## 2015-08-21 LAB — FERRITIN: FERRITIN: 92 ng/mL (ref 10–291)

## 2015-08-21 LAB — TSH: TSH: 2.262 u[IU]/mL (ref 0.350–4.500)

## 2015-08-21 NOTE — Patient Instructions (Signed)
STOP amlodipine.  Cut in half the lisinopril.  Get labs today.  Return early next week.  Call or go to the emergency room if you get worse, have trouble breathing, have chest pains, or palpitations.

## 2015-08-21 NOTE — Progress Notes (Signed)
       Darlene Lynn is a 82 y.o. female who presents to [facility] today for low up DVT, and discuss fatigue and dizziness. Patient was seen last week for leg swelling found to be related to a left ankle DVT. She was started on Eliquis. Additionally she's been seen by wound care for a right hip pressure ulcer. This is treated with a wound VAC. Before the current episode she was feeling pretty well however she's developed fatigue and feels slightly dizzy. She denies chest pain or palpitations or syncope. She continues to take her blood pressure medications as normally. She denies any frank blood in the stool. No blood loss anemia as a result of her femur fracture required ORIF a few months ago.   Past Medical History  Diagnosis Date  . Diabetes mellitus without complication ([place])   . Hypertension    No past surgical history on file. Social History  Substance Use Topics  . Smoking status: Never Smoker   . Smokeless tobacco: Not on file  . Alcohol Use: No   family history includes Diabetes in her brother, father, and sister; Hypertension in her father, mother, and sister.  ROS as above Medications: Current Outpatient Prescriptions  Medication Sig Dispense Refill  . apixaban (ELIQUIS) 5 MG TABS tablet Take 2 tablets twice a day for 7 days then decrease to 1 tablet twice a day for 6 months. For DVT. 60 tablet 5  . atorvastatin (LIPITOR) 40 MG tablet Take 40 mg by mouth daily.    . Cholecalciferol (VITAMIN D3) 2000 units TABS Take 1 tablet by mouth daily. 90 tablet 1  . doxycycline (VIBRA-TABS) 100 MG tablet Take 1 tablet (100 mg total) by mouth 2 (two) times daily. For 10 days. 20 tablet 0  . esomeprazole (NEXIUM) 20 MG capsule Take 1 capsule (20 mg total) by mouth daily. 90 capsule 1  . HYDROcodone-acetaminophen (NORCO/VICODIN) 5-325 MG tablet Take 1 tablet by mouth every 8 (eight) hours as  needed for moderate pain. 20 tablet 0  . lisinopril (PRINIVIL,ZESTRIL) 20 MG tablet Take 1 tablet (20 mg total) by mouth daily. 90 tablet 0  . metFORMIN (GLUCOPHAGE) 1000 MG tablet TAKE 1 TABLET 2 TIMES DAILY    . Multiple Vitamins-Minerals (DECUBI-VITE) CAPS Take 1 capsule by mouth daily. 90 capsule 5   No current facility-administered medications for this visit.   Allergies  Allergen Reactions  . Iodinated Diagnostic Agents Hives and Itching  . Iodine Itching and Rash  . Codeine Nausea And Vomiting  . Tape Dermatitis     Exam:  BP 90/51 mmHg  Pulse 106  Wt 112 lb (50.803 kg) Gen: Well NAD nontoxic appearing HEENT: EOMI,  MMM Lungs: Normal work of breathing. CTABL Heart: Tachycardia initially however normalizing with rest to 86 bpm no MRG Abd: NABS, Soft. Nondistended, Nontender Exts: Brisk capillary refill, warm and well perfused.  Left ankle is well-appearing without significant erythema or edema. No palpable cords.  No results found for this or any previous visit (from the past 24 hour(s)). No results found.   Please see individual assessment and plan sections.

## 2015-08-21 NOTE — Assessment & Plan Note (Signed)
Check ferritin CBC and TIBC

## 2015-08-21 NOTE — Assessment & Plan Note (Signed)
Hypotensive currently likely due to either blood loss due to anticoagulation or to over medication with antihypertensives. Plan to stop amlodipine and reduce lisinopril to 10 mg daily. Check metabolic panel. Recheck next week. Will call patient with abnormal results.

## 2015-08-21 NOTE — Assessment & Plan Note (Signed)
-   Continue Eliquis 

## 2015-08-21 NOTE — Assessment & Plan Note (Signed)
Check A1c. 

## 2015-08-22 DIAGNOSIS — S72462D Displaced supracondylar fracture with intracondylar extension of lower end of left femur, subsequent encounter for closed fracture with routine healing: Secondary | ICD-10-CM | POA: Diagnosis not present

## 2015-08-22 DIAGNOSIS — D649 Anemia, unspecified: Secondary | ICD-10-CM | POA: Diagnosis not present

## 2015-08-22 DIAGNOSIS — E119 Type 2 diabetes mellitus without complications: Secondary | ICD-10-CM | POA: Diagnosis not present

## 2015-08-22 DIAGNOSIS — I10 Essential (primary) hypertension: Secondary | ICD-10-CM | POA: Diagnosis not present

## 2015-08-22 DIAGNOSIS — K219 Gastro-esophageal reflux disease without esophagitis: Secondary | ICD-10-CM | POA: Diagnosis not present

## 2015-08-22 DIAGNOSIS — L89214 Pressure ulcer of right hip, stage 4: Secondary | ICD-10-CM | POA: Diagnosis not present

## 2015-08-22 LAB — VITAMIN D 25 HYDROXY (VIT D DEFICIENCY, FRACTURES): Vit D, 25-Hydroxy: 82 ng/mL (ref 30–100)

## 2015-08-24 ENCOUNTER — Other Ambulatory Visit: Payer: Self-pay | Admitting: Family Medicine

## 2015-08-24 DIAGNOSIS — E119 Type 2 diabetes mellitus without complications: Secondary | ICD-10-CM | POA: Diagnosis not present

## 2015-08-24 DIAGNOSIS — K219 Gastro-esophageal reflux disease without esophagitis: Secondary | ICD-10-CM | POA: Diagnosis not present

## 2015-08-24 DIAGNOSIS — I10 Essential (primary) hypertension: Secondary | ICD-10-CM | POA: Diagnosis not present

## 2015-08-24 DIAGNOSIS — D649 Anemia, unspecified: Secondary | ICD-10-CM | POA: Diagnosis not present

## 2015-08-24 DIAGNOSIS — L89214 Pressure ulcer of right hip, stage 4: Secondary | ICD-10-CM | POA: Diagnosis not present

## 2015-08-24 DIAGNOSIS — S72462D Displaced supracondylar fracture with intracondylar extension of lower end of left femur, subsequent encounter for closed fracture with routine healing: Secondary | ICD-10-CM | POA: Diagnosis not present

## 2015-08-24 NOTE — Progress Notes (Signed)
Quick Note:  Labs showed dehydration. Return as directed soon. ______

## 2015-08-25 ENCOUNTER — Telehealth: Payer: Self-pay

## 2015-08-25 DIAGNOSIS — D649 Anemia, unspecified: Secondary | ICD-10-CM | POA: Diagnosis not present

## 2015-08-25 DIAGNOSIS — I10 Essential (primary) hypertension: Secondary | ICD-10-CM | POA: Diagnosis not present

## 2015-08-25 DIAGNOSIS — S72462D Displaced supracondylar fracture with intracondylar extension of lower end of left femur, subsequent encounter for closed fracture with routine healing: Secondary | ICD-10-CM | POA: Diagnosis not present

## 2015-08-25 DIAGNOSIS — E119 Type 2 diabetes mellitus without complications: Secondary | ICD-10-CM | POA: Diagnosis not present

## 2015-08-25 DIAGNOSIS — K219 Gastro-esophageal reflux disease without esophagitis: Secondary | ICD-10-CM | POA: Diagnosis not present

## 2015-08-25 DIAGNOSIS — L89214 Pressure ulcer of right hip, stage 4: Secondary | ICD-10-CM | POA: Diagnosis not present

## 2015-08-25 MED ORDER — ATORVASTATIN CALCIUM 40 MG PO TABS: 40.0000 mg | ORAL_TABLET | Freq: Every day | ORAL | Status: DC

## 2015-08-25 NOTE — Telephone Encounter (Signed)
Pt notified that a refill of atorvastatin has been sent to her pharmacy.

## 2015-08-26 DIAGNOSIS — D649 Anemia, unspecified: Secondary | ICD-10-CM | POA: Diagnosis not present

## 2015-08-26 DIAGNOSIS — I10 Essential (primary) hypertension: Secondary | ICD-10-CM | POA: Diagnosis not present

## 2015-08-26 DIAGNOSIS — K219 Gastro-esophageal reflux disease without esophagitis: Secondary | ICD-10-CM | POA: Diagnosis not present

## 2015-08-26 DIAGNOSIS — S72462D Displaced supracondylar fracture with intracondylar extension of lower end of left femur, subsequent encounter for closed fracture with routine healing: Secondary | ICD-10-CM | POA: Diagnosis not present

## 2015-08-26 DIAGNOSIS — L89214 Pressure ulcer of right hip, stage 4: Secondary | ICD-10-CM | POA: Diagnosis not present

## 2015-08-26 DIAGNOSIS — E119 Type 2 diabetes mellitus without complications: Secondary | ICD-10-CM | POA: Diagnosis not present

## 2015-08-27 ENCOUNTER — Encounter: Payer: Self-pay | Admitting: Family Medicine

## 2015-08-27 ENCOUNTER — Ambulatory Visit (INDEPENDENT_AMBULATORY_CARE_PROVIDER_SITE_OTHER): Payer: Medicare Other | Admitting: Family Medicine

## 2015-08-27 VITALS — BP 135/80 | HR 102 | Wt 115.0 lb

## 2015-08-27 DIAGNOSIS — F4323 Adjustment disorder with mixed anxiety and depressed mood: Secondary | ICD-10-CM | POA: Diagnosis not present

## 2015-08-27 DIAGNOSIS — I10 Essential (primary) hypertension: Secondary | ICD-10-CM

## 2015-08-27 DIAGNOSIS — N179 Acute kidney failure, unspecified: Secondary | ICD-10-CM | POA: Diagnosis not present

## 2015-08-27 NOTE — Patient Instructions (Signed)
Thank you for coming in today. Return in about 3-4 weeks.  Consider counseling let me know if you want to do it.  Get labs today.

## 2015-08-27 NOTE — Assessment & Plan Note (Signed)
Well controlled after stopping amlodipine and halving lisinopril dose. Follow up in 3-4 weeks. Check kidney function today.

## 2015-08-27 NOTE — Assessment & Plan Note (Signed)
Check kidney function today to ensure recovery.

## 2015-08-27 NOTE — Assessment & Plan Note (Signed)
Likely cause of her depressed mood and memory issues. Offered counseling to patient who defers at this time. Reassured patient about the acuity of her health care needs and likely being able to get better control over the next several months with continued rehab. Will follow up as needed.

## 2015-08-27 NOTE — Progress Notes (Signed)
Darlene Lynn is a 82 y.o. female who presents to [facility]: [facility] today for follow up hypotension.  Patient has done well since we saw her last week for DVT follow up and hypotension. Patient reduced lisinopril to 10mg  and stopped taking amlodipine and has felt well since that time. Patient denies pre-syncope symptoms including any lightheadedness or vision changes.   Patient has noted depressed mood and memory issues. She notes she specifically has issues with remembering names of new people she meets and recalling street signs. She says she noticed this problem during her recent 100+ day stay in rehab and suspected it had something to do with the 4-5 days of intermittent consciousness when she was found down after a fall. Patient notes she has always been very independent and is very frustrated by having to depend on others and recurrent health issues, most recently the DVT and hypotension. In particular, she wants to return to driving. Patient has been sleeping well and eating well.    Past Medical History  Diagnosis Date  . Diabetes mellitus without complication ([place])   . Hypertension    No past surgical history on file. Social History  Substance Use Topics  . Smoking status: Never Smoker   . Smokeless tobacco: Not on file  . Alcohol Use: No   family history includes Diabetes in her brother, father, and sister; Hypertension in her father, mother, and sister.  ROS as above Medications: Current Outpatient Prescriptions  Medication Sig Dispense Refill  . apixaban (ELIQUIS) 5 MG TABS tablet Take 2 tablets twice a day for 7 days then decrease to 1 tablet twice a day for 6 months. For DVT. 60 tablet 5  . atorvastatin (LIPITOR) 40 MG tablet Take 1 tablet (40 mg total) by mouth daily. 90 tablet 1  . Cholecalciferol (VITAMIN D3) 2000 units TABS Take 1 tablet by mouth daily. 90 tablet 1  .  doxycycline (VIBRA-TABS) 100 MG tablet Take 1 tablet (100 mg total) by mouth 2 (two) times daily. For 10 days. 20 tablet 0  . esomeprazole (NEXIUM) 20 MG capsule Take 1 capsule (20 mg total) by mouth daily. 90 capsule 1  . HYDROcodone-acetaminophen (NORCO/VICODIN) 5-325 MG tablet Take 1 tablet by mouth every 8 (eight) hours as needed for moderate pain. 20 tablet 0  . lisinopril (PRINIVIL,ZESTRIL) 20 MG tablet Take 1 tablet (20 mg total) by mouth daily. 90 tablet 0  . metFORMIN (GLUCOPHAGE) 1000 MG tablet TAKE 1 TABLET 2 TIMES DAILY    . Multiple Vitamins-Minerals (DECUBI-VITE) CAPS Take 1 capsule by mouth daily. 90 capsule 5   No current facility-administered medications for this visit.   Allergies  Allergen Reactions  . Iodinated Diagnostic Agents Hives and Itching  . Iodine Itching and Rash  . Codeine Nausea And Vomiting  . Tape Dermatitis     Exam:  BP 135/80 mmHg  Pulse 102  Wt 115 lb (52.164 kg) Gen: Well NAD HEENT: EOMI,  MMM Lungs: Normal work of breathing. CTABL Heart: RRR no MRG Abd: NABS, Soft. Nondistended, Nontender Exts: Brisk capillary refill, warm and well perfused.  Psych: Well dressed, good hygiene Mood: depressed: Affect: full range Speech: normal cadence and volume Insight: fair Judgement: good Denies SI/HI Not responding to internal stimuli Attends conversation well with goal directed speech     No results found for this or any previous visit (from the past 24 hour(s)). No results found.   Please see individual assessment and plan  sections.

## 2015-08-28 DIAGNOSIS — S72462D Displaced supracondylar fracture with intracondylar extension of lower end of left femur, subsequent encounter for closed fracture with routine healing: Secondary | ICD-10-CM | POA: Diagnosis not present

## 2015-08-28 DIAGNOSIS — D649 Anemia, unspecified: Secondary | ICD-10-CM | POA: Diagnosis not present

## 2015-08-28 DIAGNOSIS — E119 Type 2 diabetes mellitus without complications: Secondary | ICD-10-CM | POA: Diagnosis not present

## 2015-08-28 DIAGNOSIS — L89214 Pressure ulcer of right hip, stage 4: Secondary | ICD-10-CM | POA: Diagnosis not present

## 2015-08-28 DIAGNOSIS — I10 Essential (primary) hypertension: Secondary | ICD-10-CM | POA: Diagnosis not present

## 2015-08-28 DIAGNOSIS — K219 Gastro-esophageal reflux disease without esophagitis: Secondary | ICD-10-CM | POA: Diagnosis not present

## 2015-08-28 LAB — BASIC METABOLIC PANEL
BUN: 11 mg/dL (ref 7–25)
CALCIUM: 10.2 mg/dL (ref 8.6–10.4)
CO2: 24 mmol/L (ref 20–31)
CREATININE: 0.65 mg/dL (ref 0.60–0.93)
Chloride: 106 mmol/L (ref 98–110)
GLUCOSE: 127 mg/dL — AB (ref 65–99)
POTASSIUM: 5.1 mmol/L (ref 3.5–5.3)
Sodium: 140 mmol/L (ref 135–146)

## 2015-08-28 NOTE — Progress Notes (Signed)
Quick Note:  Labs are looking much better ______

## 2015-08-31 DIAGNOSIS — D649 Anemia, unspecified: Secondary | ICD-10-CM | POA: Diagnosis not present

## 2015-08-31 DIAGNOSIS — E119 Type 2 diabetes mellitus without complications: Secondary | ICD-10-CM | POA: Diagnosis not present

## 2015-08-31 DIAGNOSIS — K219 Gastro-esophageal reflux disease without esophagitis: Secondary | ICD-10-CM | POA: Diagnosis not present

## 2015-08-31 DIAGNOSIS — S72462D Displaced supracondylar fracture with intracondylar extension of lower end of left femur, subsequent encounter for closed fracture with routine healing: Secondary | ICD-10-CM | POA: Diagnosis not present

## 2015-08-31 DIAGNOSIS — L89214 Pressure ulcer of right hip, stage 4: Secondary | ICD-10-CM | POA: Diagnosis not present

## 2015-08-31 DIAGNOSIS — I10 Essential (primary) hypertension: Secondary | ICD-10-CM | POA: Diagnosis not present

## 2015-09-01 DIAGNOSIS — E119 Type 2 diabetes mellitus without complications: Secondary | ICD-10-CM | POA: Diagnosis not present

## 2015-09-01 DIAGNOSIS — L89214 Pressure ulcer of right hip, stage 4: Secondary | ICD-10-CM | POA: Diagnosis not present

## 2015-09-01 DIAGNOSIS — D649 Anemia, unspecified: Secondary | ICD-10-CM | POA: Diagnosis not present

## 2015-09-01 DIAGNOSIS — S72462D Displaced supracondylar fracture with intracondylar extension of lower end of left femur, subsequent encounter for closed fracture with routine healing: Secondary | ICD-10-CM | POA: Diagnosis not present

## 2015-09-01 DIAGNOSIS — K219 Gastro-esophageal reflux disease without esophagitis: Secondary | ICD-10-CM | POA: Diagnosis not present

## 2015-09-01 DIAGNOSIS — I10 Essential (primary) hypertension: Secondary | ICD-10-CM | POA: Diagnosis not present

## 2015-09-02 DIAGNOSIS — S72462D Displaced supracondylar fracture with intracondylar extension of lower end of left femur, subsequent encounter for closed fracture with routine healing: Secondary | ICD-10-CM | POA: Diagnosis not present

## 2015-09-02 DIAGNOSIS — I10 Essential (primary) hypertension: Secondary | ICD-10-CM | POA: Diagnosis not present

## 2015-09-02 DIAGNOSIS — E119 Type 2 diabetes mellitus without complications: Secondary | ICD-10-CM | POA: Diagnosis not present

## 2015-09-02 DIAGNOSIS — K219 Gastro-esophageal reflux disease without esophagitis: Secondary | ICD-10-CM | POA: Diagnosis not present

## 2015-09-02 DIAGNOSIS — L89214 Pressure ulcer of right hip, stage 4: Secondary | ICD-10-CM | POA: Diagnosis not present

## 2015-09-02 DIAGNOSIS — D649 Anemia, unspecified: Secondary | ICD-10-CM | POA: Diagnosis not present

## 2015-09-03 DIAGNOSIS — S72462D Displaced supracondylar fracture with intracondylar extension of lower end of left femur, subsequent encounter for closed fracture with routine healing: Secondary | ICD-10-CM | POA: Diagnosis not present

## 2015-09-03 DIAGNOSIS — E119 Type 2 diabetes mellitus without complications: Secondary | ICD-10-CM | POA: Diagnosis not present

## 2015-09-03 DIAGNOSIS — I10 Essential (primary) hypertension: Secondary | ICD-10-CM | POA: Diagnosis not present

## 2015-09-03 DIAGNOSIS — K219 Gastro-esophageal reflux disease without esophagitis: Secondary | ICD-10-CM | POA: Diagnosis not present

## 2015-09-03 DIAGNOSIS — D649 Anemia, unspecified: Secondary | ICD-10-CM | POA: Diagnosis not present

## 2015-09-03 DIAGNOSIS — L89214 Pressure ulcer of right hip, stage 4: Secondary | ICD-10-CM | POA: Diagnosis not present

## 2015-09-04 DIAGNOSIS — S72462D Displaced supracondylar fracture with intracondylar extension of lower end of left femur, subsequent encounter for closed fracture with routine healing: Secondary | ICD-10-CM | POA: Diagnosis not present

## 2015-09-04 DIAGNOSIS — L89210 Pressure ulcer of right hip, unstageable: Secondary | ICD-10-CM | POA: Diagnosis not present

## 2015-09-04 DIAGNOSIS — Z7984 Long term (current) use of oral hypoglycemic drugs: Secondary | ICD-10-CM | POA: Diagnosis not present

## 2015-09-04 DIAGNOSIS — D649 Anemia, unspecified: Secondary | ICD-10-CM | POA: Diagnosis not present

## 2015-09-04 DIAGNOSIS — E119 Type 2 diabetes mellitus without complications: Secondary | ICD-10-CM | POA: Diagnosis not present

## 2015-09-04 DIAGNOSIS — L89214 Pressure ulcer of right hip, stage 4: Secondary | ICD-10-CM | POA: Diagnosis not present

## 2015-09-04 DIAGNOSIS — I10 Essential (primary) hypertension: Secondary | ICD-10-CM | POA: Diagnosis not present

## 2015-09-04 DIAGNOSIS — K219 Gastro-esophageal reflux disease without esophagitis: Secondary | ICD-10-CM | POA: Diagnosis not present

## 2015-09-07 DIAGNOSIS — I10 Essential (primary) hypertension: Secondary | ICD-10-CM | POA: Diagnosis not present

## 2015-09-07 DIAGNOSIS — L89214 Pressure ulcer of right hip, stage 4: Secondary | ICD-10-CM | POA: Diagnosis not present

## 2015-09-07 DIAGNOSIS — S72462D Displaced supracondylar fracture with intracondylar extension of lower end of left femur, subsequent encounter for closed fracture with routine healing: Secondary | ICD-10-CM | POA: Diagnosis not present

## 2015-09-07 DIAGNOSIS — D649 Anemia, unspecified: Secondary | ICD-10-CM | POA: Diagnosis not present

## 2015-09-07 DIAGNOSIS — E119 Type 2 diabetes mellitus without complications: Secondary | ICD-10-CM | POA: Diagnosis not present

## 2015-09-07 DIAGNOSIS — K219 Gastro-esophageal reflux disease without esophagitis: Secondary | ICD-10-CM | POA: Diagnosis not present

## 2015-09-08 ENCOUNTER — Ambulatory Visit: Payer: Medicare Other | Admitting: Family Medicine

## 2015-09-08 DIAGNOSIS — E119 Type 2 diabetes mellitus without complications: Secondary | ICD-10-CM | POA: Diagnosis not present

## 2015-09-08 DIAGNOSIS — I10 Essential (primary) hypertension: Secondary | ICD-10-CM | POA: Diagnosis not present

## 2015-09-08 DIAGNOSIS — S72462D Displaced supracondylar fracture with intracondylar extension of lower end of left femur, subsequent encounter for closed fracture with routine healing: Secondary | ICD-10-CM | POA: Diagnosis not present

## 2015-09-08 DIAGNOSIS — K219 Gastro-esophageal reflux disease without esophagitis: Secondary | ICD-10-CM | POA: Diagnosis not present

## 2015-09-08 DIAGNOSIS — L89214 Pressure ulcer of right hip, stage 4: Secondary | ICD-10-CM | POA: Diagnosis not present

## 2015-09-08 DIAGNOSIS — D649 Anemia, unspecified: Secondary | ICD-10-CM | POA: Diagnosis not present

## 2015-09-09 ENCOUNTER — Telehealth: Payer: Self-pay | Admitting: *Deleted

## 2015-09-09 DIAGNOSIS — S72462D Displaced supracondylar fracture with intracondylar extension of lower end of left femur, subsequent encounter for closed fracture with routine healing: Secondary | ICD-10-CM | POA: Diagnosis not present

## 2015-09-09 DIAGNOSIS — K219 Gastro-esophageal reflux disease without esophagitis: Secondary | ICD-10-CM | POA: Diagnosis not present

## 2015-09-09 DIAGNOSIS — D649 Anemia, unspecified: Secondary | ICD-10-CM | POA: Diagnosis not present

## 2015-09-09 DIAGNOSIS — I10 Essential (primary) hypertension: Secondary | ICD-10-CM | POA: Diagnosis not present

## 2015-09-09 DIAGNOSIS — L89214 Pressure ulcer of right hip, stage 4: Secondary | ICD-10-CM | POA: Diagnosis not present

## 2015-09-09 DIAGNOSIS — E119 Type 2 diabetes mellitus without complications: Secondary | ICD-10-CM | POA: Diagnosis not present

## 2015-09-09 NOTE — Telephone Encounter (Signed)
Apolonio Schneiders from [facility] called and states the patient is noncompliant with the wound vac. Apolonio Schneiders would like to know if they can use wet and dry dressing instead. [phone]) Please advise

## 2015-09-10 DIAGNOSIS — S72462D Displaced supracondylar fracture with intracondylar extension of lower end of left femur, subsequent encounter for closed fracture with routine healing: Secondary | ICD-10-CM | POA: Diagnosis not present

## 2015-09-10 DIAGNOSIS — E119 Type 2 diabetes mellitus without complications: Secondary | ICD-10-CM | POA: Diagnosis not present

## 2015-09-10 DIAGNOSIS — I10 Essential (primary) hypertension: Secondary | ICD-10-CM | POA: Diagnosis not present

## 2015-09-10 DIAGNOSIS — K219 Gastro-esophageal reflux disease without esophagitis: Secondary | ICD-10-CM | POA: Diagnosis not present

## 2015-09-10 DIAGNOSIS — D649 Anemia, unspecified: Secondary | ICD-10-CM | POA: Diagnosis not present

## 2015-09-10 DIAGNOSIS — L89214 Pressure ulcer of right hip, stage 4: Secondary | ICD-10-CM | POA: Diagnosis not present

## 2015-09-10 NOTE — Telephone Encounter (Signed)
I dont know the answer to these questions because I have literally never seen the wound because its been covered up with a wound vac. How often would the nurse recommend dressing changes.  Please contact the patient to schedule for a visit with me where we can inspect the wound and do a MMSE.

## 2015-09-10 NOTE — Telephone Encounter (Signed)
Called Apolonio Schneiders at [facility] and she wants to know how often does the provider want the dressings changed. They can no longer go to see her every day because the patient is out of prn visits and the patient is also very noncompliant. Apolonio Schneiders states she thinks the patient is starting to show signs of dementia. Please advise on how often you want the dressings changed

## 2015-09-10 NOTE — Telephone Encounter (Signed)
Ok to switch 

## 2015-09-10 NOTE — Telephone Encounter (Signed)
Called the patient and patient states she does not want to come in to see Dr. Georgina Snell because she goes to see a wound specialist. She states she just had an appointment last Friday and thinks she has one coming up.

## 2015-09-11 ENCOUNTER — Telehealth: Payer: Self-pay | Admitting: *Deleted

## 2015-09-11 MED ORDER — METFORMIN HCL 1000 MG PO TABS: 1000.0000 mg | ORAL_TABLET | Freq: Two times a day (BID) | ORAL | Status: DC

## 2015-09-11 NOTE — Telephone Encounter (Signed)
Patient request refill of metformin be [facility] to [facility] [place].Last visit for DM 08-21-2015. Since our office has not yet rx'ed this medication for her ( only historical med in chart) have to rout to provider

## 2015-09-11 NOTE — Telephone Encounter (Signed)
Called Darlene Lynn at [facility] to let her know that patient states she sees a wound care specialist. Darlene Lynn states she will do some investigating. No orders given to [facility] at this time in re wound dressing

## 2015-09-11 NOTE — Telephone Encounter (Signed)
Done

## 2015-09-14 DIAGNOSIS — K219 Gastro-esophageal reflux disease without esophagitis: Secondary | ICD-10-CM | POA: Diagnosis not present

## 2015-09-14 DIAGNOSIS — S72462D Displaced supracondylar fracture with intracondylar extension of lower end of left femur, subsequent encounter for closed fracture with routine healing: Secondary | ICD-10-CM | POA: Diagnosis not present

## 2015-09-14 DIAGNOSIS — D649 Anemia, unspecified: Secondary | ICD-10-CM | POA: Diagnosis not present

## 2015-09-14 DIAGNOSIS — I10 Essential (primary) hypertension: Secondary | ICD-10-CM | POA: Diagnosis not present

## 2015-09-14 DIAGNOSIS — E119 Type 2 diabetes mellitus without complications: Secondary | ICD-10-CM | POA: Diagnosis not present

## 2015-09-14 DIAGNOSIS — L89214 Pressure ulcer of right hip, stage 4: Secondary | ICD-10-CM | POA: Diagnosis not present

## 2015-09-16 DIAGNOSIS — D649 Anemia, unspecified: Secondary | ICD-10-CM | POA: Diagnosis not present

## 2015-09-16 DIAGNOSIS — E119 Type 2 diabetes mellitus without complications: Secondary | ICD-10-CM | POA: Diagnosis not present

## 2015-09-16 DIAGNOSIS — S72462D Displaced supracondylar fracture with intracondylar extension of lower end of left femur, subsequent encounter for closed fracture with routine healing: Secondary | ICD-10-CM | POA: Diagnosis not present

## 2015-09-16 DIAGNOSIS — K219 Gastro-esophageal reflux disease without esophagitis: Secondary | ICD-10-CM | POA: Diagnosis not present

## 2015-09-16 DIAGNOSIS — I10 Essential (primary) hypertension: Secondary | ICD-10-CM | POA: Diagnosis not present

## 2015-09-16 DIAGNOSIS — L89214 Pressure ulcer of right hip, stage 4: Secondary | ICD-10-CM | POA: Diagnosis not present

## 2015-09-17 ENCOUNTER — Ambulatory Visit: Payer: Medicare Other | Admitting: Family Medicine

## 2015-09-17 DIAGNOSIS — S72462D Displaced supracondylar fracture with intracondylar extension of lower end of left femur, subsequent encounter for closed fracture with routine healing: Secondary | ICD-10-CM | POA: Diagnosis not present

## 2015-09-17 DIAGNOSIS — K219 Gastro-esophageal reflux disease without esophagitis: Secondary | ICD-10-CM | POA: Diagnosis not present

## 2015-09-17 DIAGNOSIS — L89210 Pressure ulcer of right hip, unstageable: Secondary | ICD-10-CM | POA: Diagnosis not present

## 2015-09-17 DIAGNOSIS — E119 Type 2 diabetes mellitus without complications: Secondary | ICD-10-CM | POA: Diagnosis not present

## 2015-09-17 DIAGNOSIS — D649 Anemia, unspecified: Secondary | ICD-10-CM | POA: Diagnosis not present

## 2015-09-17 DIAGNOSIS — I10 Essential (primary) hypertension: Secondary | ICD-10-CM | POA: Diagnosis not present

## 2015-09-17 DIAGNOSIS — L89214 Pressure ulcer of right hip, stage 4: Secondary | ICD-10-CM | POA: Diagnosis not present

## 2015-09-17 DIAGNOSIS — Z7984 Long term (current) use of oral hypoglycemic drugs: Secondary | ICD-10-CM | POA: Diagnosis not present

## 2015-09-18 DIAGNOSIS — D649 Anemia, unspecified: Secondary | ICD-10-CM | POA: Diagnosis not present

## 2015-09-18 DIAGNOSIS — E119 Type 2 diabetes mellitus without complications: Secondary | ICD-10-CM | POA: Diagnosis not present

## 2015-09-18 DIAGNOSIS — I10 Essential (primary) hypertension: Secondary | ICD-10-CM | POA: Diagnosis not present

## 2015-09-18 DIAGNOSIS — K219 Gastro-esophageal reflux disease without esophagitis: Secondary | ICD-10-CM | POA: Diagnosis not present

## 2015-09-18 DIAGNOSIS — L89214 Pressure ulcer of right hip, stage 4: Secondary | ICD-10-CM | POA: Diagnosis not present

## 2015-09-18 DIAGNOSIS — S72462D Displaced supracondylar fracture with intracondylar extension of lower end of left femur, subsequent encounter for closed fracture with routine healing: Secondary | ICD-10-CM | POA: Diagnosis not present

## 2015-09-21 DIAGNOSIS — D649 Anemia, unspecified: Secondary | ICD-10-CM | POA: Diagnosis not present

## 2015-09-21 DIAGNOSIS — K219 Gastro-esophageal reflux disease without esophagitis: Secondary | ICD-10-CM | POA: Diagnosis not present

## 2015-09-21 DIAGNOSIS — E119 Type 2 diabetes mellitus without complications: Secondary | ICD-10-CM | POA: Diagnosis not present

## 2015-09-21 DIAGNOSIS — S72462D Displaced supracondylar fracture with intracondylar extension of lower end of left femur, subsequent encounter for closed fracture with routine healing: Secondary | ICD-10-CM | POA: Diagnosis not present

## 2015-09-21 DIAGNOSIS — L89214 Pressure ulcer of right hip, stage 4: Secondary | ICD-10-CM | POA: Diagnosis not present

## 2015-09-21 DIAGNOSIS — I10 Essential (primary) hypertension: Secondary | ICD-10-CM | POA: Diagnosis not present

## 2015-09-22 ENCOUNTER — Encounter: Payer: Self-pay | Admitting: Family Medicine

## 2015-09-22 ENCOUNTER — Ambulatory Visit (INDEPENDENT_AMBULATORY_CARE_PROVIDER_SITE_OTHER): Payer: Medicare Other | Admitting: Family Medicine

## 2015-09-22 VITALS — BP 155/62 | HR 109 | Wt 111.0 lb

## 2015-09-22 DIAGNOSIS — S72452G Displaced supracondylar fracture without intracondylar extension of lower end of left femur, subsequent encounter for closed fracture with delayed healing: Secondary | ICD-10-CM

## 2015-09-22 DIAGNOSIS — E119 Type 2 diabetes mellitus without complications: Secondary | ICD-10-CM | POA: Diagnosis not present

## 2015-09-22 DIAGNOSIS — I82492 Acute embolism and thrombosis of other specified deep vein of left lower extremity: Secondary | ICD-10-CM

## 2015-09-22 DIAGNOSIS — D649 Anemia, unspecified: Secondary | ICD-10-CM | POA: Diagnosis not present

## 2015-09-22 DIAGNOSIS — I10 Essential (primary) hypertension: Secondary | ICD-10-CM | POA: Diagnosis not present

## 2015-09-22 DIAGNOSIS — K219 Gastro-esophageal reflux disease without esophagitis: Secondary | ICD-10-CM | POA: Diagnosis not present

## 2015-09-22 DIAGNOSIS — S72462D Displaced supracondylar fracture with intracondylar extension of lower end of left femur, subsequent encounter for closed fracture with routine healing: Secondary | ICD-10-CM | POA: Diagnosis not present

## 2015-09-22 DIAGNOSIS — L89214 Pressure ulcer of right hip, stage 4: Secondary | ICD-10-CM | POA: Diagnosis not present

## 2015-09-22 DIAGNOSIS — Z794 Long term (current) use of insulin: Secondary | ICD-10-CM

## 2015-09-22 DIAGNOSIS — M810 Age-related osteoporosis without current pathological fracture: Secondary | ICD-10-CM

## 2015-09-22 DIAGNOSIS — L8990 Pressure ulcer of unspecified site, unspecified stage: Secondary | ICD-10-CM | POA: Diagnosis not present

## 2015-09-22 DIAGNOSIS — I82452 Acute embolism and thrombosis of left peroneal vein: Secondary | ICD-10-CM

## 2015-09-22 MED ORDER — ZOSTER VACCINE LIVE 19400 UNT/0.65ML ~~LOC~~ SOLR: 0.6500 mL | Freq: Once | SUBCUTANEOUS | Status: DC

## 2015-09-22 MED ORDER — AMLODIPINE BESYLATE 10 MG PO TABS: 10.0000 mg | ORAL_TABLET | Freq: Every day | ORAL | Status: DC

## 2015-09-22 MED ORDER — ESOMEPRAZOLE MAGNESIUM 40 MG PO CPDR: 40.0000 mg | DELAYED_RELEASE_CAPSULE | Freq: Every day | ORAL | Status: DC

## 2015-09-22 MED ORDER — LISINOPRIL 10 MG PO TABS: 10.0000 mg | ORAL_TABLET | Freq: Every day | ORAL | Status: DC

## 2015-09-22 MED ORDER — ATORVASTATIN CALCIUM 40 MG PO TABS: 40.0000 mg | ORAL_TABLET | Freq: Every day | ORAL | Status: DC

## 2015-09-22 NOTE — Assessment & Plan Note (Signed)
Doing well. Continue Eliquis.

## 2015-09-22 NOTE — Patient Instructions (Signed)
Thank you for coming in today. Increase nexium to 40mg  daily.  Decrease lisinopril to 10mg  daily.  Restart amlodipine blood pressure medicine.  Return in 1 month.  Continue we to dry dressing changes.  Continue the eat well.   Denosumab injection PROLIA What is this medicine? DENOSUMAB (den oh sue mab) slows bone breakdown. Prolia is used to treat osteoporosis in women after menopause and in men. Delton See is used to prevent bone fractures and other bone problems caused by cancer bone metastases. Delton See is also used to treat giant cell tumor of the bone. This medicine may be used for other purposes; ask your health care provider or pharmacist if you have questions. What should I tell my health care provider before I take this medicine? They need to know if you have any of these conditions: -dental disease -eczema -infection or history of infections -kidney disease or on dialysis -low blood calcium or vitamin D -malabsorption syndrome -scheduled to have surgery or tooth extraction -taking medicine that contains denosumab -thyroid or parathyroid disease -an unusual reaction to denosumab, other medicines, foods, dyes, or preservatives -pregnant or trying to get pregnant -breast-feeding How should I use this medicine? This medicine is for injection under the skin. It is given by a health care professional in a hospital or clinic setting. If you are getting Prolia, a special MedGuide will be given to you by the pharmacist with each prescription and refill. Be sure to read this information carefully each time. For Prolia, talk to your pediatrician regarding the use of this medicine in children. Special care may be needed. For Delton See, talk to your pediatrician regarding the use of this medicine in children. While this drug may be prescribed for children as young as 13 years for selected conditions, precautions do apply. Overdosage: If you think you have taken too much of this medicine contact a  poison control center or emergency room at once. NOTE: This medicine is only for you. Do not share this medicine with others. What if I miss a dose? It is important not to miss your dose. Call your doctor or health care professional if you are unable to keep an appointment. What may interact with this medicine? Do not take this medicine with any of the following medications: -other medicines containing denosumab This medicine may also interact with the following medications: -medicines that suppress the immune system -medicines that treat cancer -steroid medicines like prednisone or cortisone This list may not describe all possible interactions. Give your health care provider a list of all the medicines, herbs, non-prescription drugs, or dietary supplements you use. Also tell them if you smoke, drink alcohol, or use illegal drugs. Some items may interact with your medicine. What should I watch for while using this medicine? Visit your doctor or health care professional for regular checks on your progress. Your doctor or health care professional may order blood tests and other tests to see how you are doing. Call your doctor or health care professional if you get a cold or other infection while receiving this medicine. Do not treat yourself. This medicine may decrease your body's ability to fight infection. You should make sure you get enough calcium and vitamin D while you are taking this medicine, unless your doctor tells you not to. Discuss the foods you eat and the vitamins you take with your health care professional. See your dentist regularly. Brush and floss your teeth as directed. Before you have any dental work done, tell your dentist you are  receiving this medicine. Do not become pregnant while taking this medicine or for 5 months after stopping it. Women should inform their doctor if they wish to become pregnant or think they might be pregnant. There is a potential for serious side effects  to an unborn child. Talk to your health care professional or pharmacist for more information. What side effects may I notice from receiving this medicine? Side effects that you should report to your doctor or health care professional as soon as possible: -allergic reactions like skin rash, itching or hives, swelling of the face, lips, or tongue -breathing problems -chest pain -fast, irregular heartbeat -feeling faint or lightheaded, falls -fever, chills, or any other sign of infection -muscle spasms, tightening, or twitches -numbness or tingling -skin blisters or bumps, or is dry, peels, or red -slow healing or unexplained pain in the mouth or jaw -unusual bleeding or bruising Side effects that usually do not require medical attention (Report these to your doctor or health care professional if they continue or are bothersome.): -muscle pain -stomach upset, gas This list may not describe all possible side effects. Call your doctor for medical advice about side effects. You may report side effects to FDA at 1-800-FDA-1088. Where should I keep my medicine? This medicine is only given in a clinic, doctor's office, or other health care setting and will not be stored at home. NOTE: This sheet is a summary. It may not cover all possible information. If you have questions about this medicine, talk to your doctor, pharmacist, or health care provider.    2016, Elsevier/Gold Standard. (2012-01-02 12:37:47)

## 2015-09-22 NOTE — Assessment & Plan Note (Signed)
Controlled continue metformin

## 2015-09-22 NOTE — Assessment & Plan Note (Signed)
Continue lisinopril 10. Add amlodipine. Recheck in one month.

## 2015-09-22 NOTE — Assessment & Plan Note (Signed)
Doing well. Continue wet-to-dry dressings per wound management.

## 2015-09-22 NOTE — Assessment & Plan Note (Addendum)
Based on history of decreased bone density previous diagnosis of osteoporosisand recent fracture. I don't think there is any need for a new bone density test. Plan to start Prolia. We'll investigate and plan for injection in one month.

## 2015-09-22 NOTE — Progress Notes (Signed)
Darlene Lynn is a 82 y.o. female who presents to [facility] today for follow-up hypertension, decubitus ulcer, and mental fogginess. \  Hypertension: Patient had hypertension previously controlled with lisinopril 20 mg and amlodipine 10 mg daily. This worked until she became dehydrated and she had an acute kidney injury and hypertension. She stopped the amlodipine and decreased lisinopril to 10 mg. She notes she's been tolerating this regimen quite well with no chest pain palpitations or shortness of breath.  Decubitus ulcer: Right lateral hip. Previously managed with a wound VAC. Patient notes that she has become allergic to the adhesive used with the wound VAC and has switched to wet-to-dry dressings. She feels this is going well and is healing up nicely. She denies any pain.  Mental fogginess. Patient was in a coma for several days and was delirious for 2 weeks following her fall several months ago. She notes that she is not as mentally sharp as she used to be. She has difficulty remembering names sometimes.  Bone density: Patient has a previous history of osteoporosis. She has tried bisphosphonates in the past and currently takes vitamin D and calcium. She suffered a left leg fracture several months ago. She is interested in Prolia.   Past Medical History  Diagnosis Date  . Diabetes mellitus without complication ([place])   . Hypertension    No past surgical history on file. Social History  Substance Use Topics  . Smoking status: Never Smoker   . Smokeless tobacco: Not on file  . Alcohol Use: No   family history includes Diabetes in her brother, father, and sister; Hypertension in her father, mother, and sister.  ROS as above Medications: Current Outpatient Prescriptions  Medication Sig Dispense Refill  . apixaban (ELIQUIS) 5 MG TABS tablet Take 2 tablets twice a day for 7 days  then decrease to 1 tablet twice a day for 6 months. For DVT. 60 tablet 5  . atorvastatin (LIPITOR) 40 MG tablet Take 1 tablet (40 mg total) by mouth daily. 90 tablet 1  . Cholecalciferol (VITAMIN D3) 2000 units TABS Take 1 tablet by mouth daily. 90 tablet 1  . esomeprazole (NEXIUM) 40 MG capsule Take 1 capsule (40 mg total) by mouth daily. 90 capsule 1  . lisinopril (PRINIVIL,ZESTRIL) 10 MG tablet Take 1 tablet (10 mg total) by mouth daily. 90 tablet 0  . metFORMIN (GLUCOPHAGE) 1000 MG tablet Take 1 tablet (1,000 mg total) by mouth 2 (two) times daily with a meal. 60 tablet 1  . Multiple Vitamins-Minerals (DECUBI-VITE) CAPS Take 1 capsule by mouth daily. 90 capsule 5  . zoster vaccine live, PF, (ZOSTAVAX) 16109 UNT/0.65ML injection Inject 19,400 Units into the skin once. If given in pharmacy fax report to Dr Georgina Snell [phone] 1 each 0   No current facility-administered medications for this visit.   Allergies  Allergen Reactions  . Iodinated Diagnostic Agents Hives and Itching  . Iodine Itching and Rash  . Codeine Nausea And Vomiting  . Tape Dermatitis     Exam:  BP 155/62 mmHg  Pulse 109  Wt 111 lb (50.349 kg)  SpO2 97% Gen: Well NAD alert and oriented bright and cheerful. HEENT: EOMI,  MMM Lungs: Normal work of breathing. CTABL Heart: RRR no MRG heart rate per my check 80 bpm Abd: NABS, Soft. Nondistended, Nontender Exts: Brisk capillary refill, warm and well perfused.  Skin: Wound right lateral hip approximately 1 cm in diameter and 1 cm deep  with good granulation tissue without discharge. Neuro alert and oriented normal speech thought process and affect. Mini-Mental Status exam is 30/30. PHQ9 is 1  No results found for this or any previous visit (from the past 24 hour(s)). No results found.   Please see individual assessment and plan sections.

## 2015-09-22 NOTE — Addendum Note (Signed)
Addended by: Gregor Hams on: 09/22/2015 02:23 PM   Modules accepted: Orders

## 2015-09-23 NOTE — Progress Notes (Signed)
Pt has Medicare, no auth required for Prolia. Pt can be scheduled.

## 2015-09-25 DIAGNOSIS — L89214 Pressure ulcer of right hip, stage 4: Secondary | ICD-10-CM | POA: Diagnosis not present

## 2015-09-25 DIAGNOSIS — I10 Essential (primary) hypertension: Secondary | ICD-10-CM | POA: Diagnosis not present

## 2015-09-25 DIAGNOSIS — E119 Type 2 diabetes mellitus without complications: Secondary | ICD-10-CM | POA: Diagnosis not present

## 2015-09-25 DIAGNOSIS — S72462D Displaced supracondylar fracture with intracondylar extension of lower end of left femur, subsequent encounter for closed fracture with routine healing: Secondary | ICD-10-CM | POA: Diagnosis not present

## 2015-09-25 DIAGNOSIS — D649 Anemia, unspecified: Secondary | ICD-10-CM | POA: Diagnosis not present

## 2015-09-25 DIAGNOSIS — K219 Gastro-esophageal reflux disease without esophagitis: Secondary | ICD-10-CM | POA: Diagnosis not present

## 2015-09-28 DIAGNOSIS — I10 Essential (primary) hypertension: Secondary | ICD-10-CM | POA: Diagnosis not present

## 2015-09-28 DIAGNOSIS — S72462D Displaced supracondylar fracture with intracondylar extension of lower end of left femur, subsequent encounter for closed fracture with routine healing: Secondary | ICD-10-CM | POA: Diagnosis not present

## 2015-09-28 DIAGNOSIS — D649 Anemia, unspecified: Secondary | ICD-10-CM | POA: Diagnosis not present

## 2015-09-28 DIAGNOSIS — E119 Type 2 diabetes mellitus without complications: Secondary | ICD-10-CM | POA: Diagnosis not present

## 2015-09-28 DIAGNOSIS — L89214 Pressure ulcer of right hip, stage 4: Secondary | ICD-10-CM | POA: Diagnosis not present

## 2015-09-28 DIAGNOSIS — K219 Gastro-esophageal reflux disease without esophagitis: Secondary | ICD-10-CM | POA: Diagnosis not present

## 2015-09-29 DIAGNOSIS — L89214 Pressure ulcer of right hip, stage 4: Secondary | ICD-10-CM | POA: Diagnosis not present

## 2015-09-29 DIAGNOSIS — D649 Anemia, unspecified: Secondary | ICD-10-CM | POA: Diagnosis not present

## 2015-09-29 DIAGNOSIS — S72462D Displaced supracondylar fracture with intracondylar extension of lower end of left femur, subsequent encounter for closed fracture with routine healing: Secondary | ICD-10-CM | POA: Diagnosis not present

## 2015-09-29 DIAGNOSIS — I10 Essential (primary) hypertension: Secondary | ICD-10-CM | POA: Diagnosis not present

## 2015-09-29 DIAGNOSIS — K219 Gastro-esophageal reflux disease without esophagitis: Secondary | ICD-10-CM | POA: Diagnosis not present

## 2015-09-29 DIAGNOSIS — E119 Type 2 diabetes mellitus without complications: Secondary | ICD-10-CM | POA: Diagnosis not present

## 2015-10-01 DIAGNOSIS — L89210 Pressure ulcer of right hip, unstageable: Secondary | ICD-10-CM | POA: Diagnosis not present

## 2015-10-01 DIAGNOSIS — K219 Gastro-esophageal reflux disease without esophagitis: Secondary | ICD-10-CM | POA: Diagnosis not present

## 2015-10-01 DIAGNOSIS — L89214 Pressure ulcer of right hip, stage 4: Secondary | ICD-10-CM | POA: Diagnosis not present

## 2015-10-01 DIAGNOSIS — I10 Essential (primary) hypertension: Secondary | ICD-10-CM | POA: Diagnosis not present

## 2015-10-01 DIAGNOSIS — D649 Anemia, unspecified: Secondary | ICD-10-CM | POA: Diagnosis not present

## 2015-10-01 DIAGNOSIS — S72462D Displaced supracondylar fracture with intracondylar extension of lower end of left femur, subsequent encounter for closed fracture with routine healing: Secondary | ICD-10-CM | POA: Diagnosis not present

## 2015-10-01 DIAGNOSIS — E119 Type 2 diabetes mellitus without complications: Secondary | ICD-10-CM | POA: Diagnosis not present

## 2015-10-07 DIAGNOSIS — L89214 Pressure ulcer of right hip, stage 4: Secondary | ICD-10-CM | POA: Diagnosis not present

## 2015-10-07 DIAGNOSIS — D649 Anemia, unspecified: Secondary | ICD-10-CM | POA: Diagnosis not present

## 2015-10-07 DIAGNOSIS — E119 Type 2 diabetes mellitus without complications: Secondary | ICD-10-CM | POA: Diagnosis not present

## 2015-10-07 DIAGNOSIS — I10 Essential (primary) hypertension: Secondary | ICD-10-CM | POA: Diagnosis not present

## 2015-10-07 DIAGNOSIS — K219 Gastro-esophageal reflux disease without esophagitis: Secondary | ICD-10-CM | POA: Diagnosis not present

## 2015-10-07 DIAGNOSIS — S72462D Displaced supracondylar fracture with intracondylar extension of lower end of left femur, subsequent encounter for closed fracture with routine healing: Secondary | ICD-10-CM | POA: Diagnosis not present

## 2015-10-08 DIAGNOSIS — L89214 Pressure ulcer of right hip, stage 4: Secondary | ICD-10-CM | POA: Diagnosis not present

## 2015-10-08 DIAGNOSIS — K219 Gastro-esophageal reflux disease without esophagitis: Secondary | ICD-10-CM | POA: Diagnosis not present

## 2015-10-08 DIAGNOSIS — D649 Anemia, unspecified: Secondary | ICD-10-CM | POA: Diagnosis not present

## 2015-10-08 DIAGNOSIS — S72462D Displaced supracondylar fracture with intracondylar extension of lower end of left femur, subsequent encounter for closed fracture with routine healing: Secondary | ICD-10-CM | POA: Diagnosis not present

## 2015-10-08 DIAGNOSIS — I10 Essential (primary) hypertension: Secondary | ICD-10-CM | POA: Diagnosis not present

## 2015-10-08 DIAGNOSIS — E119 Type 2 diabetes mellitus without complications: Secondary | ICD-10-CM | POA: Diagnosis not present

## 2015-10-09 DIAGNOSIS — Z7984 Long term (current) use of oral hypoglycemic drugs: Secondary | ICD-10-CM | POA: Diagnosis not present

## 2015-10-09 DIAGNOSIS — D649 Anemia, unspecified: Secondary | ICD-10-CM | POA: Diagnosis not present

## 2015-10-09 DIAGNOSIS — I10 Essential (primary) hypertension: Secondary | ICD-10-CM | POA: Diagnosis not present

## 2015-10-09 DIAGNOSIS — E119 Type 2 diabetes mellitus without complications: Secondary | ICD-10-CM | POA: Diagnosis not present

## 2015-10-09 DIAGNOSIS — S72462D Displaced supracondylar fracture with intracondylar extension of lower end of left femur, subsequent encounter for closed fracture with routine healing: Secondary | ICD-10-CM | POA: Diagnosis not present

## 2015-10-09 DIAGNOSIS — L89214 Pressure ulcer of right hip, stage 4: Secondary | ICD-10-CM | POA: Diagnosis not present

## 2015-10-09 DIAGNOSIS — E782 Mixed hyperlipidemia: Secondary | ICD-10-CM | POA: Diagnosis not present

## 2015-10-09 DIAGNOSIS — F329 Major depressive disorder, single episode, unspecified: Secondary | ICD-10-CM | POA: Diagnosis not present

## 2015-10-09 DIAGNOSIS — K219 Gastro-esophageal reflux disease without esophagitis: Secondary | ICD-10-CM | POA: Diagnosis not present

## 2015-10-14 DIAGNOSIS — L89214 Pressure ulcer of right hip, stage 4: Secondary | ICD-10-CM | POA: Diagnosis not present

## 2015-10-14 DIAGNOSIS — D649 Anemia, unspecified: Secondary | ICD-10-CM | POA: Diagnosis not present

## 2015-10-14 DIAGNOSIS — E119 Type 2 diabetes mellitus without complications: Secondary | ICD-10-CM | POA: Diagnosis not present

## 2015-10-14 DIAGNOSIS — I10 Essential (primary) hypertension: Secondary | ICD-10-CM | POA: Diagnosis not present

## 2015-10-14 DIAGNOSIS — K219 Gastro-esophageal reflux disease without esophagitis: Secondary | ICD-10-CM | POA: Diagnosis not present

## 2015-10-14 DIAGNOSIS — S72462D Displaced supracondylar fracture with intracondylar extension of lower end of left femur, subsequent encounter for closed fracture with routine healing: Secondary | ICD-10-CM | POA: Diagnosis not present

## 2015-10-15 DIAGNOSIS — K219 Gastro-esophageal reflux disease without esophagitis: Secondary | ICD-10-CM | POA: Diagnosis not present

## 2015-10-15 DIAGNOSIS — D649 Anemia, unspecified: Secondary | ICD-10-CM | POA: Diagnosis not present

## 2015-10-15 DIAGNOSIS — S72462D Displaced supracondylar fracture with intracondylar extension of lower end of left femur, subsequent encounter for closed fracture with routine healing: Secondary | ICD-10-CM | POA: Diagnosis not present

## 2015-10-15 DIAGNOSIS — I10 Essential (primary) hypertension: Secondary | ICD-10-CM | POA: Diagnosis not present

## 2015-10-15 DIAGNOSIS — L89210 Pressure ulcer of right hip, unstageable: Secondary | ICD-10-CM | POA: Diagnosis not present

## 2015-10-15 DIAGNOSIS — L89214 Pressure ulcer of right hip, stage 4: Secondary | ICD-10-CM | POA: Diagnosis not present

## 2015-10-15 DIAGNOSIS — E119 Type 2 diabetes mellitus without complications: Secondary | ICD-10-CM | POA: Diagnosis not present

## 2015-10-17 DIAGNOSIS — D649 Anemia, unspecified: Secondary | ICD-10-CM | POA: Diagnosis not present

## 2015-10-17 DIAGNOSIS — E119 Type 2 diabetes mellitus without complications: Secondary | ICD-10-CM | POA: Diagnosis not present

## 2015-10-17 DIAGNOSIS — K219 Gastro-esophageal reflux disease without esophagitis: Secondary | ICD-10-CM | POA: Diagnosis not present

## 2015-10-17 DIAGNOSIS — L89214 Pressure ulcer of right hip, stage 4: Secondary | ICD-10-CM | POA: Diagnosis not present

## 2015-10-17 DIAGNOSIS — I10 Essential (primary) hypertension: Secondary | ICD-10-CM | POA: Diagnosis not present

## 2015-10-17 DIAGNOSIS — S72462D Displaced supracondylar fracture with intracondylar extension of lower end of left femur, subsequent encounter for closed fracture with routine healing: Secondary | ICD-10-CM | POA: Diagnosis not present

## 2015-10-19 DIAGNOSIS — E119 Type 2 diabetes mellitus without complications: Secondary | ICD-10-CM | POA: Diagnosis not present

## 2015-10-19 DIAGNOSIS — K219 Gastro-esophageal reflux disease without esophagitis: Secondary | ICD-10-CM | POA: Diagnosis not present

## 2015-10-19 DIAGNOSIS — L89214 Pressure ulcer of right hip, stage 4: Secondary | ICD-10-CM | POA: Diagnosis not present

## 2015-10-19 DIAGNOSIS — S72462D Displaced supracondylar fracture with intracondylar extension of lower end of left femur, subsequent encounter for closed fracture with routine healing: Secondary | ICD-10-CM | POA: Diagnosis not present

## 2015-10-19 DIAGNOSIS — I10 Essential (primary) hypertension: Secondary | ICD-10-CM | POA: Diagnosis not present

## 2015-10-19 DIAGNOSIS — D649 Anemia, unspecified: Secondary | ICD-10-CM | POA: Diagnosis not present

## 2015-10-20 DIAGNOSIS — E119 Type 2 diabetes mellitus without complications: Secondary | ICD-10-CM | POA: Diagnosis not present

## 2015-10-20 DIAGNOSIS — I10 Essential (primary) hypertension: Secondary | ICD-10-CM | POA: Diagnosis not present

## 2015-10-20 DIAGNOSIS — L89214 Pressure ulcer of right hip, stage 4: Secondary | ICD-10-CM | POA: Diagnosis not present

## 2015-10-20 DIAGNOSIS — S72462D Displaced supracondylar fracture with intracondylar extension of lower end of left femur, subsequent encounter for closed fracture with routine healing: Secondary | ICD-10-CM | POA: Diagnosis not present

## 2015-10-20 DIAGNOSIS — K219 Gastro-esophageal reflux disease without esophagitis: Secondary | ICD-10-CM | POA: Diagnosis not present

## 2015-10-20 DIAGNOSIS — D649 Anemia, unspecified: Secondary | ICD-10-CM | POA: Diagnosis not present

## 2015-10-22 ENCOUNTER — Encounter: Payer: Self-pay | Admitting: Family Medicine

## 2015-10-22 ENCOUNTER — Ambulatory Visit (INDEPENDENT_AMBULATORY_CARE_PROVIDER_SITE_OTHER): Payer: Medicare Other | Admitting: Family Medicine

## 2015-10-22 VITALS — BP 142/72 | HR 104 | Wt 109.0 lb

## 2015-10-22 DIAGNOSIS — I10 Essential (primary) hypertension: Secondary | ICD-10-CM | POA: Diagnosis not present

## 2015-10-22 DIAGNOSIS — D62 Acute posthemorrhagic anemia: Secondary | ICD-10-CM

## 2015-10-22 DIAGNOSIS — Z23 Encounter for immunization: Secondary | ICD-10-CM | POA: Diagnosis not present

## 2015-10-22 DIAGNOSIS — E119 Type 2 diabetes mellitus without complications: Secondary | ICD-10-CM

## 2015-10-22 DIAGNOSIS — M810 Age-related osteoporosis without current pathological fracture: Secondary | ICD-10-CM

## 2015-10-22 DIAGNOSIS — M899 Disorder of bone, unspecified: Secondary | ICD-10-CM | POA: Diagnosis not present

## 2015-10-22 DIAGNOSIS — Z794 Long term (current) use of insulin: Secondary | ICD-10-CM

## 2015-10-22 DIAGNOSIS — M949 Disorder of cartilage, unspecified: Secondary | ICD-10-CM

## 2015-10-22 DIAGNOSIS — I82452 Acute embolism and thrombosis of left peroneal vein: Secondary | ICD-10-CM

## 2015-10-22 DIAGNOSIS — I82492 Acute embolism and thrombosis of other specified deep vein of left lower extremity: Secondary | ICD-10-CM

## 2015-10-22 MED ORDER — DENOSUMAB 60 MG/ML ~~LOC~~ SOLN
60.0000 mg | Freq: Once | SUBCUTANEOUS | Status: AC
  Administered 2015-10-22: 60 mg via SUBCUTANEOUS

## 2015-10-22 NOTE — Assessment & Plan Note (Signed)
Doing well No changes 

## 2015-10-22 NOTE — Patient Instructions (Addendum)
Thank you for coming in today. Continue your medicine.  Get fasting labs.  Continue good nutrition.  Ask your pharmacist or your insurance company if   [place] or [place] would be cheaper than Eliquis.   Return in 3 months.   Denosumab injection What is this medicine? DENOSUMAB (den oh sue mab) slows bone breakdown. Prolia is used to treat osteoporosis in women after menopause and in men. Delton See is used to prevent bone fractures and other bone problems caused by cancer bone metastases. Delton See is also used to treat giant cell tumor of the bone. This medicine may be used for other purposes; ask your health care provider or pharmacist if you have questions. What should I tell my health care provider before I take this medicine? They need to know if you have any of these conditions: -dental disease -eczema -infection or history of infections -kidney disease or on dialysis -low blood calcium or vitamin D -malabsorption syndrome -scheduled to have surgery or tooth extraction -taking medicine that contains denosumab -thyroid or parathyroid disease -an unusual reaction to denosumab, other medicines, foods, dyes, or preservatives -pregnant or trying to get pregnant -breast-feeding How should I use this medicine? This medicine is for injection under the skin. It is given by a health care professional in a hospital or clinic setting. If you are getting Prolia, a special MedGuide will be given to you by the pharmacist with each prescription and refill. Be sure to read this information carefully each time. For Prolia, talk to your pediatrician regarding the use of this medicine in children. Special care may be needed. For Delton See, talk to your pediatrician regarding the use of this medicine in children. While this drug may be prescribed for children as young as 13 years for selected conditions, precautions do apply. Overdosage: If you think you have taken too much of this medicine contact a poison  control center or emergency room at once. NOTE: This medicine is only for you. Do not share this medicine with others. What if I miss a dose? It is important not to miss your dose. Call your doctor or health care professional if you are unable to keep an appointment. What may interact with this medicine? Do not take this medicine with any of the following medications: -other medicines containing denosumab This medicine may also interact with the following medications: -medicines that suppress the immune system -medicines that treat cancer -steroid medicines like prednisone or cortisone This list may not describe all possible interactions. Give your health care provider a list of all the medicines, herbs, non-prescription drugs, or dietary supplements you use. Also tell them if you smoke, drink alcohol, or use illegal drugs. Some items may interact with your medicine. What should I watch for while using this medicine? Visit your doctor or health care professional for regular checks on your progress. Your doctor or health care professional may order blood tests and other tests to see how you are doing. Call your doctor or health care professional if you get a cold or other infection while receiving this medicine. Do not treat yourself. This medicine may decrease your body's ability to fight infection. You should make sure you get enough calcium and vitamin D while you are taking this medicine, unless your doctor tells you not to. Discuss the foods you eat and the vitamins you take with your health care professional. See your dentist regularly. Brush and floss your teeth as directed. Before you have any dental work done, tell your dentist you  are receiving this medicine. Do not become pregnant while taking this medicine or for 5 months after stopping it. Women should inform their doctor if they wish to become pregnant or think they might be pregnant. There is a potential for serious side effects to an  unborn child. Talk to your health care professional or pharmacist for more information. What side effects may I notice from receiving this medicine? Side effects that you should report to your doctor or health care professional as soon as possible: -allergic reactions like skin rash, itching or hives, swelling of the face, lips, or tongue -breathing problems -chest pain -fast, irregular heartbeat -feeling faint or lightheaded, falls -fever, chills, or any other sign of infection -muscle spasms, tightening, or twitches -numbness or tingling -skin blisters or bumps, or is dry, peels, or red -slow healing or unexplained pain in the mouth or jaw -unusual bleeding or bruising Side effects that usually do not require medical attention (Report these to your doctor or health care professional if they continue or are bothersome.): -muscle pain -stomach upset, gas This list may not describe all possible side effects. Call your doctor for medical advice about side effects. You may report side effects to FDA at 1-800-FDA-1088. Where should I keep my medicine? This medicine is only given in a clinic, doctor's office, or other health care setting and will not be stored at home. NOTE: This sheet is a summary. It may not cover all possible information. If you have questions about this medicine, talk to your doctor, pharmacist, or health care provider.    2016, Elsevier/Gold Standard. (2012-01-02 12:37:47)

## 2015-10-22 NOTE — Assessment & Plan Note (Signed)
Reasonably at goal. No changes today. Recheck in a few months.

## 2015-10-22 NOTE — Progress Notes (Signed)
       Darlene Lynn is a 82 y.o. female who presents to [facility]: [facility] today for follow-up hypertension and diabetes and DVT.  Diabetes: Doing well with metformin. Blood sugars are typically well controlled. She tolerates the medicine well with no urinary frequency urgency or polyuria.  Hypertension: Doing well. No chest pains palpitations or shortness of breath. Her home blood pressures are typically in the 120s to 0000000 range systolically. She denies any lightheadedness weakness or dizziness.  DVT: Tolerating Eliquis well. She notes it costs over $400.   Past Medical History  Diagnosis Date  . Diabetes mellitus without complication ([place])   . Hypertension    No past surgical history on file. Social History  Substance Use Topics  . Smoking status: Never Smoker   . Smokeless tobacco: Not on file  . Alcohol Use: No   family history includes Diabetes in her brother, father, and sister; Hypertension in her father, mother, and sister.  ROS as above Medications: Current Outpatient Prescriptions  Medication Sig Dispense Refill  . amLODipine (NORVASC) 10 MG tablet Take 1 tablet (10 mg total) by mouth daily. 30 tablet 0  . apixaban (ELIQUIS) 5 MG TABS tablet Take 2 tablets twice a day for 7 days then decrease to 1 tablet twice a day for 6 months. For DVT. 60 tablet 5  . atorvastatin (LIPITOR) 40 MG tablet Take 1 tablet (40 mg total) by mouth daily. 90 tablet 1  . Cholecalciferol (VITAMIN D3) 2000 units TABS Take 1 tablet by mouth daily. 90 tablet 1  . esomeprazole (NEXIUM) 40 MG capsule Take 1 capsule (40 mg total) by mouth daily. 90 capsule 1  . lisinopril (PRINIVIL,ZESTRIL) 10 MG tablet Take 1 tablet (10 mg total) by mouth daily. 90 tablet 0  . metFORMIN (GLUCOPHAGE) 1000 MG tablet Take 1 tablet (1,000 mg total) by mouth 2 (two) times daily with a meal. 60 tablet 1  . Multiple  Vitamins-Minerals (DECUBI-VITE) CAPS Take 1 capsule by mouth daily. 90 capsule 5  . zoster vaccine live, PF, (ZOSTAVAX) 16109 UNT/0.65ML injection Inject 19,400 Units into the skin once. If given in pharmacy fax report to Dr Georgina Snell [phone] 1 each 0   No current facility-administered medications for this visit.   Allergies  Allergen Reactions  . Iodinated Diagnostic Agents Hives and Itching  . Iodine Itching and Rash  . Codeine Nausea And Vomiting  . Tape Dermatitis     Exam:  BP 142/72 mmHg  Pulse 104  Wt 109 lb (49.442 kg) Gen: Well NAD HEENT: EOMI,  MMM Lungs: Normal work of breathing. CTABL Heart: RRR no MRG Abd: NABS, Soft. Nondistended, Nontender Exts: Brisk capillary refill, warm and well perfused.  Normal gait  No results found for this or any previous visit (from the past 24 hour(s)). No results found.   Please see individual assessment and plan sections.

## 2015-10-22 NOTE — Assessment & Plan Note (Signed)
Continue Eliquis. We'll check to see if there is a cheaper option.

## 2015-10-24 DIAGNOSIS — D649 Anemia, unspecified: Secondary | ICD-10-CM | POA: Diagnosis not present

## 2015-10-24 DIAGNOSIS — S72462D Displaced supracondylar fracture with intracondylar extension of lower end of left femur, subsequent encounter for closed fracture with routine healing: Secondary | ICD-10-CM | POA: Diagnosis not present

## 2015-10-24 DIAGNOSIS — L89214 Pressure ulcer of right hip, stage 4: Secondary | ICD-10-CM | POA: Diagnosis not present

## 2015-10-24 DIAGNOSIS — E119 Type 2 diabetes mellitus without complications: Secondary | ICD-10-CM | POA: Diagnosis not present

## 2015-10-24 DIAGNOSIS — K219 Gastro-esophageal reflux disease without esophagitis: Secondary | ICD-10-CM | POA: Diagnosis not present

## 2015-10-24 DIAGNOSIS — I10 Essential (primary) hypertension: Secondary | ICD-10-CM | POA: Diagnosis not present

## 2015-10-26 DIAGNOSIS — D649 Anemia, unspecified: Secondary | ICD-10-CM | POA: Diagnosis not present

## 2015-10-26 DIAGNOSIS — S72462D Displaced supracondylar fracture with intracondylar extension of lower end of left femur, subsequent encounter for closed fracture with routine healing: Secondary | ICD-10-CM | POA: Diagnosis not present

## 2015-10-26 DIAGNOSIS — K219 Gastro-esophageal reflux disease without esophagitis: Secondary | ICD-10-CM | POA: Diagnosis not present

## 2015-10-26 DIAGNOSIS — I10 Essential (primary) hypertension: Secondary | ICD-10-CM | POA: Diagnosis not present

## 2015-10-26 DIAGNOSIS — E119 Type 2 diabetes mellitus without complications: Secondary | ICD-10-CM | POA: Diagnosis not present

## 2015-10-26 DIAGNOSIS — L89214 Pressure ulcer of right hip, stage 4: Secondary | ICD-10-CM | POA: Diagnosis not present

## 2015-10-29 DIAGNOSIS — L89210 Pressure ulcer of right hip, unstageable: Secondary | ICD-10-CM | POA: Diagnosis not present

## 2015-10-29 DIAGNOSIS — E119 Type 2 diabetes mellitus without complications: Secondary | ICD-10-CM | POA: Diagnosis not present

## 2015-10-29 DIAGNOSIS — L89214 Pressure ulcer of right hip, stage 4: Secondary | ICD-10-CM | POA: Diagnosis not present

## 2015-10-29 DIAGNOSIS — K219 Gastro-esophageal reflux disease without esophagitis: Secondary | ICD-10-CM | POA: Diagnosis not present

## 2015-10-29 DIAGNOSIS — D649 Anemia, unspecified: Secondary | ICD-10-CM | POA: Diagnosis not present

## 2015-10-29 DIAGNOSIS — S72462D Displaced supracondylar fracture with intracondylar extension of lower end of left femur, subsequent encounter for closed fracture with routine healing: Secondary | ICD-10-CM | POA: Diagnosis not present

## 2015-10-29 DIAGNOSIS — I10 Essential (primary) hypertension: Secondary | ICD-10-CM | POA: Diagnosis not present

## 2015-10-31 DIAGNOSIS — L89214 Pressure ulcer of right hip, stage 4: Secondary | ICD-10-CM | POA: Diagnosis not present

## 2015-10-31 DIAGNOSIS — S72462D Displaced supracondylar fracture with intracondylar extension of lower end of left femur, subsequent encounter for closed fracture with routine healing: Secondary | ICD-10-CM | POA: Diagnosis not present

## 2015-10-31 DIAGNOSIS — I10 Essential (primary) hypertension: Secondary | ICD-10-CM | POA: Diagnosis not present

## 2015-10-31 DIAGNOSIS — E119 Type 2 diabetes mellitus without complications: Secondary | ICD-10-CM | POA: Diagnosis not present

## 2015-10-31 DIAGNOSIS — D649 Anemia, unspecified: Secondary | ICD-10-CM | POA: Diagnosis not present

## 2015-10-31 DIAGNOSIS — K219 Gastro-esophageal reflux disease without esophagitis: Secondary | ICD-10-CM | POA: Diagnosis not present

## 2015-11-02 DIAGNOSIS — I10 Essential (primary) hypertension: Secondary | ICD-10-CM | POA: Diagnosis not present

## 2015-11-02 DIAGNOSIS — S72462D Displaced supracondylar fracture with intracondylar extension of lower end of left femur, subsequent encounter for closed fracture with routine healing: Secondary | ICD-10-CM | POA: Diagnosis not present

## 2015-11-02 DIAGNOSIS — L89214 Pressure ulcer of right hip, stage 4: Secondary | ICD-10-CM | POA: Diagnosis not present

## 2015-11-02 DIAGNOSIS — K219 Gastro-esophageal reflux disease without esophagitis: Secondary | ICD-10-CM | POA: Diagnosis not present

## 2015-11-02 DIAGNOSIS — D649 Anemia, unspecified: Secondary | ICD-10-CM | POA: Diagnosis not present

## 2015-11-02 DIAGNOSIS — E119 Type 2 diabetes mellitus without complications: Secondary | ICD-10-CM | POA: Diagnosis not present

## 2015-11-05 DIAGNOSIS — I10 Essential (primary) hypertension: Secondary | ICD-10-CM | POA: Diagnosis not present

## 2015-11-05 DIAGNOSIS — L97112 Non-pressure chronic ulcer of right thigh with fat layer exposed: Secondary | ICD-10-CM | POA: Diagnosis not present

## 2015-11-05 DIAGNOSIS — L89313 Pressure ulcer of right buttock, stage 3: Secondary | ICD-10-CM | POA: Diagnosis not present

## 2015-11-05 DIAGNOSIS — D649 Anemia, unspecified: Secondary | ICD-10-CM | POA: Diagnosis not present

## 2015-11-05 DIAGNOSIS — K219 Gastro-esophageal reflux disease without esophagitis: Secondary | ICD-10-CM | POA: Diagnosis not present

## 2015-11-05 DIAGNOSIS — Z7984 Long term (current) use of oral hypoglycemic drugs: Secondary | ICD-10-CM | POA: Diagnosis not present

## 2015-11-05 DIAGNOSIS — S72462D Displaced supracondylar fracture with intracondylar extension of lower end of left femur, subsequent encounter for closed fracture with routine healing: Secondary | ICD-10-CM | POA: Diagnosis not present

## 2015-11-05 DIAGNOSIS — E119 Type 2 diabetes mellitus without complications: Secondary | ICD-10-CM | POA: Diagnosis not present

## 2015-11-05 DIAGNOSIS — L89214 Pressure ulcer of right hip, stage 4: Secondary | ICD-10-CM | POA: Diagnosis not present

## 2015-11-06 DIAGNOSIS — I10 Essential (primary) hypertension: Secondary | ICD-10-CM | POA: Diagnosis not present

## 2015-11-06 DIAGNOSIS — L89214 Pressure ulcer of right hip, stage 4: Secondary | ICD-10-CM | POA: Diagnosis not present

## 2015-11-06 DIAGNOSIS — D649 Anemia, unspecified: Secondary | ICD-10-CM | POA: Diagnosis not present

## 2015-11-06 DIAGNOSIS — K219 Gastro-esophageal reflux disease without esophagitis: Secondary | ICD-10-CM | POA: Diagnosis not present

## 2015-11-06 DIAGNOSIS — E119 Type 2 diabetes mellitus without complications: Secondary | ICD-10-CM | POA: Diagnosis not present

## 2015-11-06 DIAGNOSIS — S72462D Displaced supracondylar fracture with intracondylar extension of lower end of left femur, subsequent encounter for closed fracture with routine healing: Secondary | ICD-10-CM | POA: Diagnosis not present

## 2015-11-09 DIAGNOSIS — I10 Essential (primary) hypertension: Secondary | ICD-10-CM | POA: Diagnosis not present

## 2015-11-09 DIAGNOSIS — D649 Anemia, unspecified: Secondary | ICD-10-CM | POA: Diagnosis not present

## 2015-11-09 DIAGNOSIS — L89214 Pressure ulcer of right hip, stage 4: Secondary | ICD-10-CM | POA: Diagnosis not present

## 2015-11-09 DIAGNOSIS — E119 Type 2 diabetes mellitus without complications: Secondary | ICD-10-CM | POA: Diagnosis not present

## 2015-11-09 DIAGNOSIS — K219 Gastro-esophageal reflux disease without esophagitis: Secondary | ICD-10-CM | POA: Diagnosis not present

## 2015-11-09 DIAGNOSIS — S72462D Displaced supracondylar fracture with intracondylar extension of lower end of left femur, subsequent encounter for closed fracture with routine healing: Secondary | ICD-10-CM | POA: Diagnosis not present

## 2015-11-11 ENCOUNTER — Other Ambulatory Visit: Payer: Self-pay | Admitting: Family Medicine

## 2015-11-12 DIAGNOSIS — S72462D Displaced supracondylar fracture with intracondylar extension of lower end of left femur, subsequent encounter for closed fracture with routine healing: Secondary | ICD-10-CM | POA: Diagnosis not present

## 2015-11-12 DIAGNOSIS — I10 Essential (primary) hypertension: Secondary | ICD-10-CM | POA: Diagnosis not present

## 2015-11-12 DIAGNOSIS — D649 Anemia, unspecified: Secondary | ICD-10-CM | POA: Diagnosis not present

## 2015-11-12 DIAGNOSIS — L89214 Pressure ulcer of right hip, stage 4: Secondary | ICD-10-CM | POA: Diagnosis not present

## 2015-11-12 DIAGNOSIS — K219 Gastro-esophageal reflux disease without esophagitis: Secondary | ICD-10-CM | POA: Diagnosis not present

## 2015-11-12 DIAGNOSIS — E119 Type 2 diabetes mellitus without complications: Secondary | ICD-10-CM | POA: Diagnosis not present

## 2015-11-13 DIAGNOSIS — D62 Acute posthemorrhagic anemia: Secondary | ICD-10-CM | POA: Diagnosis not present

## 2015-11-13 DIAGNOSIS — M949 Disorder of cartilage, unspecified: Secondary | ICD-10-CM | POA: Diagnosis not present

## 2015-11-13 DIAGNOSIS — I10 Essential (primary) hypertension: Secondary | ICD-10-CM | POA: Diagnosis not present

## 2015-11-13 DIAGNOSIS — E119 Type 2 diabetes mellitus without complications: Secondary | ICD-10-CM | POA: Diagnosis not present

## 2015-11-13 DIAGNOSIS — Z794 Long term (current) use of insulin: Secondary | ICD-10-CM | POA: Diagnosis not present

## 2015-11-13 DIAGNOSIS — M899 Disorder of bone, unspecified: Secondary | ICD-10-CM | POA: Diagnosis not present

## 2015-11-13 LAB — CBC
HEMATOCRIT: 39.2 % (ref 35.0–45.0)
Hemoglobin: 13 g/dL (ref 11.7–15.5)
MCH: 28.5 pg (ref 27.0–33.0)
MCHC: 33.2 g/dL (ref 32.0–36.0)
MCV: 86 fL (ref 80.0–100.0)
MPV: 9.7 fL (ref 7.5–12.5)
Platelets: 312 10*3/uL (ref 140–400)
RBC: 4.56 MIL/uL (ref 3.80–5.10)
RDW: 14.5 % (ref 11.0–15.0)
WBC: 8.6 10*3/uL (ref 3.8–10.8)

## 2015-11-14 LAB — COMPREHENSIVE METABOLIC PANEL
ALT: 11 U/L (ref 6–29)
AST: 15 U/L (ref 10–35)
Albumin: 4 g/dL (ref 3.6–5.1)
Alkaline Phosphatase: 80 U/L (ref 33–130)
BUN: 14 mg/dL (ref 7–25)
CALCIUM: 9.9 mg/dL (ref 8.6–10.4)
CHLORIDE: 102 mmol/L (ref 98–110)
CO2: 24 mmol/L (ref 20–31)
Creat: 0.61 mg/dL (ref 0.60–0.93)
GLUCOSE: 126 mg/dL — AB (ref 65–99)
POTASSIUM: 4.1 mmol/L (ref 3.5–5.3)
Sodium: 139 mmol/L (ref 135–146)
Total Bilirubin: 0.7 mg/dL (ref 0.2–1.2)
Total Protein: 6.5 g/dL (ref 6.1–8.1)

## 2015-11-14 LAB — LIPID PANEL
CHOL/HDL RATIO: 2.5 ratio (ref <=5.0)
Cholesterol: 121 mg/dL — ABNORMAL LOW (ref 125–200)
HDL: 48 mg/dL (ref >=46)
LDL CALC: 49 mg/dL (ref <130)
Triglycerides: 119 mg/dL (ref <150)
VLDL: 24 mg/dL (ref <30)

## 2015-11-14 NOTE — Progress Notes (Signed)
Quick Note:  Labs are ok  ______ 

## 2015-11-16 DIAGNOSIS — I10 Essential (primary) hypertension: Secondary | ICD-10-CM | POA: Diagnosis not present

## 2015-11-16 DIAGNOSIS — S72462D Displaced supracondylar fracture with intracondylar extension of lower end of left femur, subsequent encounter for closed fracture with routine healing: Secondary | ICD-10-CM | POA: Diagnosis not present

## 2015-11-16 DIAGNOSIS — K219 Gastro-esophageal reflux disease without esophagitis: Secondary | ICD-10-CM | POA: Diagnosis not present

## 2015-11-16 DIAGNOSIS — D649 Anemia, unspecified: Secondary | ICD-10-CM | POA: Diagnosis not present

## 2015-11-16 DIAGNOSIS — L89214 Pressure ulcer of right hip, stage 4: Secondary | ICD-10-CM | POA: Diagnosis not present

## 2015-11-16 DIAGNOSIS — E119 Type 2 diabetes mellitus without complications: Secondary | ICD-10-CM | POA: Diagnosis not present

## 2015-11-19 DIAGNOSIS — L89313 Pressure ulcer of right buttock, stage 3: Secondary | ICD-10-CM | POA: Diagnosis not present

## 2015-11-19 DIAGNOSIS — Z7984 Long term (current) use of oral hypoglycemic drugs: Secondary | ICD-10-CM | POA: Diagnosis not present

## 2015-11-23 DIAGNOSIS — I10 Essential (primary) hypertension: Secondary | ICD-10-CM | POA: Diagnosis not present

## 2015-11-23 DIAGNOSIS — L89214 Pressure ulcer of right hip, stage 4: Secondary | ICD-10-CM | POA: Diagnosis not present

## 2015-11-23 DIAGNOSIS — D649 Anemia, unspecified: Secondary | ICD-10-CM | POA: Diagnosis not present

## 2015-11-23 DIAGNOSIS — E119 Type 2 diabetes mellitus without complications: Secondary | ICD-10-CM | POA: Diagnosis not present

## 2015-11-23 DIAGNOSIS — S72462D Displaced supracondylar fracture with intracondylar extension of lower end of left femur, subsequent encounter for closed fracture with routine healing: Secondary | ICD-10-CM | POA: Diagnosis not present

## 2015-11-23 DIAGNOSIS — K219 Gastro-esophageal reflux disease without esophagitis: Secondary | ICD-10-CM | POA: Diagnosis not present

## 2015-11-30 DIAGNOSIS — L89214 Pressure ulcer of right hip, stage 4: Secondary | ICD-10-CM | POA: Diagnosis not present

## 2015-11-30 DIAGNOSIS — E119 Type 2 diabetes mellitus without complications: Secondary | ICD-10-CM | POA: Diagnosis not present

## 2015-11-30 DIAGNOSIS — D649 Anemia, unspecified: Secondary | ICD-10-CM | POA: Diagnosis not present

## 2015-11-30 DIAGNOSIS — K219 Gastro-esophageal reflux disease without esophagitis: Secondary | ICD-10-CM | POA: Diagnosis not present

## 2015-11-30 DIAGNOSIS — S72462D Displaced supracondylar fracture with intracondylar extension of lower end of left femur, subsequent encounter for closed fracture with routine healing: Secondary | ICD-10-CM | POA: Diagnosis not present

## 2015-11-30 DIAGNOSIS — I10 Essential (primary) hypertension: Secondary | ICD-10-CM | POA: Diagnosis not present

## 2015-12-17 DIAGNOSIS — L89313 Pressure ulcer of right buttock, stage 3: Secondary | ICD-10-CM | POA: Diagnosis not present

## 2015-12-17 DIAGNOSIS — Z7984 Long term (current) use of oral hypoglycemic drugs: Secondary | ICD-10-CM | POA: Diagnosis not present

## 2016-01-25 ENCOUNTER — Ambulatory Visit (INDEPENDENT_AMBULATORY_CARE_PROVIDER_SITE_OTHER): Payer: Medicare Other | Admitting: Family Medicine

## 2016-01-25 ENCOUNTER — Encounter: Payer: Self-pay | Admitting: Family Medicine

## 2016-01-25 VITALS — BP 167/90 | HR 104 | Wt 113.0 lb

## 2016-01-25 DIAGNOSIS — E119 Type 2 diabetes mellitus without complications: Secondary | ICD-10-CM | POA: Diagnosis not present

## 2016-01-25 DIAGNOSIS — M899 Disorder of bone, unspecified: Secondary | ICD-10-CM | POA: Diagnosis not present

## 2016-01-25 DIAGNOSIS — M949 Disorder of cartilage, unspecified: Secondary | ICD-10-CM

## 2016-01-25 DIAGNOSIS — Z794 Long term (current) use of insulin: Secondary | ICD-10-CM

## 2016-01-25 DIAGNOSIS — I82452 Acute embolism and thrombosis of left peroneal vein: Secondary | ICD-10-CM

## 2016-01-25 DIAGNOSIS — M810 Age-related osteoporosis without current pathological fracture: Secondary | ICD-10-CM

## 2016-01-25 DIAGNOSIS — I82492 Acute embolism and thrombosis of other specified deep vein of left lower extremity: Secondary | ICD-10-CM | POA: Diagnosis not present

## 2016-01-25 DIAGNOSIS — I10 Essential (primary) hypertension: Secondary | ICD-10-CM | POA: Diagnosis not present

## 2016-01-25 LAB — COMPREHENSIVE METABOLIC PANEL
ALT: 16 U/L (ref 6–29)
AST: 18 U/L (ref 10–35)
Albumin: 4.4 g/dL (ref 3.6–5.1)
Alkaline Phosphatase: 73 U/L (ref 33–130)
BUN: 9 mg/dL (ref 7–25)
CHLORIDE: 102 mmol/L (ref 98–110)
CO2: 27 mmol/L (ref 20–31)
CREATININE: 0.63 mg/dL (ref 0.60–0.88)
Calcium: 10.7 mg/dL — ABNORMAL HIGH (ref 8.6–10.4)
GLUCOSE: 148 mg/dL — AB (ref 65–99)
POTASSIUM: 4.6 mmol/L (ref 3.5–5.3)
SODIUM: 140 mmol/L (ref 135–146)
Total Bilirubin: 0.4 mg/dL (ref 0.2–1.2)
Total Protein: 7.3 g/dL (ref 6.1–8.1)

## 2016-01-25 LAB — CBC
HCT: 41.3 % (ref 35.0–45.0)
HEMOGLOBIN: 13.8 g/dL (ref 11.7–15.5)
MCH: 28.6 pg (ref 27.0–33.0)
MCHC: 33.4 g/dL (ref 32.0–36.0)
MCV: 85.7 fL (ref 80.0–100.0)
MPV: 10.4 fL (ref 7.5–12.5)
Platelets: 393 10*3/uL (ref 140–400)
RBC: 4.82 MIL/uL (ref 3.80–5.10)
RDW: 15.1 % — AB (ref 11.0–15.0)
WBC: 9.6 10*3/uL (ref 3.8–10.8)

## 2016-01-25 LAB — HEMOGLOBIN A1C
Hgb A1c MFr Bld: 6.5 % — ABNORMAL HIGH (ref <5.7)
MEAN PLASMA GLUCOSE: 140 mg/dL

## 2016-01-25 NOTE — Progress Notes (Signed)
Darlene Lynn is a 82 y.o. female who presents to [facility] today for a 3 month wellness visit.  She is complaining of feeling unsteady on her left knee with walking, worse when walking up and down hills.  She also endorses mild left leg weakness.  Patient claims this has been going on since breaking her left leg last year.  She denies lower extremity numbness and tingling.  Patient also complains of chronic lower back soreness that is worse with activity and better with rest.  She is not taking any over the counter medications for this.   Hypertension:  Recently had her amlodipine increased to 10 mg daily.  Says her blood pressure has been elevated recently with systolic pressures in the 140-150s.  No issues with her medications.  No chest pain, light headedness, or dizziness.   Diabetes Mellitus: Blood sugars well controlled on metformin.  She denies urinary frequency.    History of DVT: Finishing up last month of Eliquis therapy (6 months total).  No leg swelling or pain.  Denies shortness of breath   Past Medical History  Diagnosis Date  . Diabetes mellitus without complication ([place])   . Hypertension    No past surgical history on file. Social History  Substance Use Topics  . Smoking status: Never Smoker   . Smokeless tobacco: Not on file  . Alcohol Use: No   family history includes Diabetes in her brother, father, and sister; Hypertension in her father, mother, and sister.  ROS as above:  Medications: Current Outpatient Prescriptions  Medication Sig Dispense Refill  . amLODipine (NORVASC) 10 MG tablet Take 1 tablet (10 mg total) by mouth daily. 30 tablet 0  . apixaban (ELIQUIS) 5 MG TABS tablet Take 2 tablets twice a day for 7 days then decrease to 1 tablet twice a day for 6 months. For DVT. 60 tablet 5  . atorvastatin (LIPITOR) 40 MG tablet Take 1 tablet  (40 mg total) by mouth daily. 90 tablet 1  . Cholecalciferol (VITAMIN D3) 2000 units TABS Take 1 tablet by mouth daily. 90 tablet 1  . esomeprazole (NEXIUM) 40 MG capsule Take 1 capsule (40 mg total) by mouth daily. 90 capsule 1  . lisinopril (PRINIVIL,ZESTRIL) 10 MG tablet Take 1 tablet (10 mg total) by mouth daily. 90 tablet 0  . metFORMIN (GLUCOPHAGE) 1000 MG tablet TAKE ONE TABLET BY MOUTH TWICE DAILY WITH MEALS 60 tablet 2  . Multiple Vitamins-Minerals (DECUBI-VITE) CAPS Take 1 capsule by mouth daily. 90 capsule 5   No current facility-administered medications for this visit.   Allergies  Allergen Reactions  . Iodinated Diagnostic Agents Hives and Itching  . Iodine Itching and Rash  . Codeine Nausea And Vomiting  . Tape Dermatitis     Exam:  BP 167/90 mmHg  Pulse 104  Wt 113 lb (51.256 kg)  SpO2 96% Gen: Well NAD HEENT: EOMI,  MMM Lungs: Normal work of breathing. CTABL Heart: RRR no MRG Abd: NABS, Soft. Nondistended, Nontender Exts: Brisk capillary refill, warm and well perfused. No lower extremity edema.  Peripheral pulses 2+ throughout Neuro:  5/5 strength in her bilateral lower extremities. 2+ patellar reflexes bilaterally.   No results found for this or any previous visit (from the past 24 hour(s)). No results found.    Assessment and Plan: 82 y.o. female with a history of hypertension, diabetes mellitus type II, osteoporosis, and DVT who presents for a 3 month  wellness visit.  Patient is doing well.  Her only complaints are left leg unsteadiness and lower back soreness. Unsteadiness is likely a result of her previous left leg injury.  Encouraged her to resume her home physical therapy exercises that she stopped doing recently.   Lower back soreness has been going on for years.  No neurologic signs or red flags such as weight loss.  Soreness likely a musculoskeletal origin.  We encouraged her to take tylenol as needed, Use a heating pad and continue with physical  activity as tolerated.     Diabetes mellitus:  Check A1c.  Blood sugars have been well controlled. Continue metformin.  Hypertension: Blood pressure elevated to 167/90 in the office.   Has been running in the [id number] systolic at home. Continue with current regimen. Recheck in 3 months.   History of DVT: Last month of Eliquis therapy for 1st provoked DVT.  No indication for long term prophylaxis.  Osteoporosis: Due for biannual Prolia shot.  Patient will return for nurse visit.    Discussed warning signs or symptoms. Please see discharge instructions. Patient expresses understanding.

## 2016-01-25 NOTE — Patient Instructions (Addendum)
Thank you for coming in today. Get labs Return for a nurse visit for prolia soon.  We will call you when it is ready.  Return in 3 months.

## 2016-01-25 NOTE — Progress Notes (Signed)
Prolia ordered

## 2016-01-26 LAB — PTH, INTACT AND CALCIUM
Calcium: 10.7 mg/dL — ABNORMAL HIGH (ref 8.4–10.5)
PTH: 22 pg/mL (ref 14–64)

## 2016-01-26 LAB — VITAMIN D 25 HYDROXY (VIT D DEFICIENCY, FRACTURES): VIT D 25 HYDROXY: 67 ng/mL (ref 30–100)

## 2016-01-26 NOTE — Progress Notes (Signed)
Quick Note:  All labs are essentially normal or unchanged. ______

## 2016-01-26 NOTE — Progress Notes (Signed)
Quick Note:  Labs look great so far. One lab is still pending. ______

## 2016-01-28 ENCOUNTER — Ambulatory Visit (INDEPENDENT_AMBULATORY_CARE_PROVIDER_SITE_OTHER): Payer: Medicare Other | Admitting: Family Medicine

## 2016-01-28 VITALS — BP 145/75 | HR 87 | Wt 113.0 lb

## 2016-01-28 DIAGNOSIS — M810 Age-related osteoporosis without current pathological fracture: Secondary | ICD-10-CM | POA: Diagnosis not present

## 2016-01-28 DIAGNOSIS — M899 Disorder of bone, unspecified: Secondary | ICD-10-CM

## 2016-01-28 DIAGNOSIS — M949 Disorder of cartilage, unspecified: Secondary | ICD-10-CM

## 2016-01-28 MED ORDER — DENOSUMAB 60 MG/ML ~~LOC~~ SOLN
60.0000 mg | Freq: Once | SUBCUTANEOUS | Status: AC
  Administered 2016-01-28: 60 mg via SUBCUTANEOUS

## 2016-01-28 NOTE — Progress Notes (Signed)
   Subjective:    Patient ID: Darlene Lynn, female    DOB: 08/20/1933, 82 y.o.   MRN: [id number]  HPI    Review of Systems     Objective:   Physical Exam        Assessment & Plan:

## 2016-01-28 NOTE — Progress Notes (Signed)
Darlene Lynn presents to clinic for a prolia injection.  Denies itchy, swelling, warmth, tenderness, and redness of the skin. Pt tolerated injection in the low right abdomin well without any complications. -[facility]

## 2016-02-08 ENCOUNTER — Other Ambulatory Visit: Payer: Self-pay | Admitting: Family Medicine

## 2016-02-12 ENCOUNTER — Other Ambulatory Visit: Payer: Self-pay | Admitting: Family Medicine

## 2016-03-14 ENCOUNTER — Other Ambulatory Visit: Payer: Self-pay | Admitting: Family Medicine

## 2016-03-22 ENCOUNTER — Other Ambulatory Visit: Payer: Self-pay | Admitting: Family Medicine

## 2016-03-22 DIAGNOSIS — I10 Essential (primary) hypertension: Secondary | ICD-10-CM

## 2016-04-11 ENCOUNTER — Other Ambulatory Visit: Payer: Self-pay | Admitting: Family Medicine

## 2016-04-26 ENCOUNTER — Encounter: Payer: Self-pay | Admitting: Family Medicine

## 2016-04-26 ENCOUNTER — Ambulatory Visit (INDEPENDENT_AMBULATORY_CARE_PROVIDER_SITE_OTHER): Payer: Medicare Other | Admitting: Family Medicine

## 2016-04-26 VITALS — BP 132/74 | HR 90 | Wt 117.0 lb

## 2016-04-26 DIAGNOSIS — I10 Essential (primary) hypertension: Secondary | ICD-10-CM

## 2016-04-26 DIAGNOSIS — Z794 Long term (current) use of insulin: Secondary | ICD-10-CM

## 2016-04-26 DIAGNOSIS — Z66 Do not resuscitate: Secondary | ICD-10-CM | POA: Diagnosis not present

## 2016-04-26 DIAGNOSIS — E119 Type 2 diabetes mellitus without complications: Secondary | ICD-10-CM

## 2016-04-26 DIAGNOSIS — M810 Age-related osteoporosis without current pathological fracture: Secondary | ICD-10-CM

## 2016-04-26 DIAGNOSIS — Z23 Encounter for immunization: Secondary | ICD-10-CM

## 2016-04-26 LAB — POCT GLYCOSYLATED HEMOGLOBIN (HGB A1C): HEMOGLOBIN A1C: 5.4

## 2016-04-26 NOTE — Progress Notes (Signed)
       Darlene Lynn is a 82 y.o. female who presents to [facility]: [facility] today for follow up diabetes, HTN, and Osteoporosis.   Diabetes: Patient has well-controlled diabetes with the below medication. No polyuria polydipsia fevers or chills.  Hypertension: Patient is doing well with the below medications with no chest pain shortness of breath lightheadedness or dizziness. Next  Osteoporosis: Managed with Prolia and weightbearing exercises. She feels well.  Goals of care: Patient is interested in a DO NOT RESUSCITATE order and is interested in filling out an advance directive   Past Medical History:  Diagnosis Date  . Diabetes mellitus without complication ([place])   . Hypertension    No past surgical history on file. Social History  Substance Use Topics  . Smoking status: Never Smoker  . Smokeless tobacco: Not on file  . Alcohol use No   family history includes Diabetes in her brother, father, and sister; Hypertension in her father, mother, and sister.  ROS as above:  Medications: Current Outpatient Prescriptions  Medication Sig Dispense Refill  . amLODipine (NORVASC) 10 MG tablet TAKE ONE TABLET BY MOUTH ONCE DAILY 30 tablet 2  . atorvastatin (LIPITOR) 40 MG tablet Take 1 tablet (40 mg total) by mouth daily. 90 tablet 1  . Cholecalciferol (VITAMIN D3) 2000 units TABS Take 1 tablet by mouth daily. 90 tablet 1  . denosumab (PROLIA) 60 MG/ML SOLN injection Inject 60 mg into the skin every 6 (six) months. Administer in upper arm, thigh, or abdomen    . esomeprazole (NEXIUM) 40 MG capsule Take 1 capsule (40 mg total) by mouth daily. 90 capsule 1  . lisinopril (PRINIVIL,ZESTRIL) 10 MG tablet TAKE ONE TABLET BY MOUTH ONCE DAILY 90 tablet 0  . metFORMIN (GLUCOPHAGE) 1000 MG tablet TAKE ONE TABLET BY MOUTH TWICE DAILY WITH MEALS 60 tablet 0  . Multiple Vitamins-Minerals  (DECUBI-VITE) CAPS Take 1 capsule by mouth daily. 90 capsule 5   No current facility-administered medications for this visit.    Allergies  Allergen Reactions  . Iodinated Diagnostic Agents Hives and Itching  . Iodine Itching and Rash  . Codeine Nausea And Vomiting  . Tape Dermatitis     Exam:  BP 132/74   Pulse 90   Wt 117 lb (53.1 kg)   BMI 22.85 kg/m  Gen: Well NAD HEENT: EOMI,  MMM Lungs: Normal work of breathing. CTABL Heart: RRR no MRG Abd: NABS, Soft. Nondistended, Nontender Exts: Brisk capillary refill, warm and well perfused.   Results for orders placed or performed in visit on 04/26/16 (from the past 24 hour(s))  POCT HgB A1C     Status: None   Collection Time: 04/26/16  1:00 PM  Result Value Ref Range   Hemoglobin A1C 5.4     No results found.    Assessment and Plan: 82 y.o. female with  Diabetes: Doing well. A1c pending. Continue current regimen and recheck in 3 - 6 months. Attention: Doing well continue current regimen. Osteoporosis: Doing well continue current regimen. Goals of care: DO NOT RESUSCITATE status obtained. Patient will fill out advanced directive form   Orders Placed This Encounter  Procedures  . Pneumococcal conjugate vaccine 13-valent IM  . POCT HgB A1C    Discussed warning signs or symptoms. Please see discharge instructions. Patient expresses understanding.

## 2016-04-26 NOTE — Patient Instructions (Signed)
Thank you for coming in today. Return after January 13th for Prolia Injection.  Follow up with Me in 6 months.   Please fill out and mail to me your advanced directive.

## 2016-05-09 ENCOUNTER — Other Ambulatory Visit: Payer: Self-pay | Admitting: Family Medicine

## 2016-05-13 ENCOUNTER — Other Ambulatory Visit: Payer: Self-pay | Admitting: Family Medicine

## 2016-06-06 ENCOUNTER — Other Ambulatory Visit: Payer: Self-pay | Admitting: Family Medicine

## 2016-06-18 ENCOUNTER — Other Ambulatory Visit: Payer: Self-pay | Admitting: Family Medicine

## 2016-06-18 DIAGNOSIS — I10 Essential (primary) hypertension: Secondary | ICD-10-CM

## 2016-08-01 ENCOUNTER — Ambulatory Visit: Payer: Medicare Other

## 2016-08-01 ENCOUNTER — Other Ambulatory Visit: Payer: Self-pay

## 2016-08-01 DIAGNOSIS — M816 Localized osteoporosis [Lequesne]: Secondary | ICD-10-CM

## 2016-08-01 LAB — COMPLETE METABOLIC PANEL WITH GFR
ALK PHOS: 62 U/L (ref 33–130)
ALT: 14 U/L (ref 6–29)
AST: 18 U/L (ref 10–35)
Albumin: 4.4 g/dL (ref 3.6–5.1)
BUN: 11 mg/dL (ref 7–25)
CALCIUM: 10.1 mg/dL (ref 8.6–10.4)
CHLORIDE: 103 mmol/L (ref 98–110)
CO2: 22 mmol/L (ref 20–31)
Creat: 0.73 mg/dL (ref 0.60–0.88)
GFR, EST NON AFRICAN AMERICAN: 78 mL/min (ref >=60)
Glucose, Bld: 127 mg/dL — ABNORMAL HIGH (ref 65–99)
POTASSIUM: 4 mmol/L (ref 3.5–5.3)
Sodium: 141 mmol/L (ref 135–146)
Total Bilirubin: 0.5 mg/dL (ref 0.2–1.2)
Total Protein: 7.2 g/dL (ref 6.1–8.1)

## 2016-08-08 ENCOUNTER — Ambulatory Visit: Payer: Medicare Other

## 2016-08-08 ENCOUNTER — Other Ambulatory Visit: Payer: Self-pay | Admitting: Family Medicine

## 2016-08-10 ENCOUNTER — Ambulatory Visit (INDEPENDENT_AMBULATORY_CARE_PROVIDER_SITE_OTHER): Payer: Medicare Other | Admitting: Family Medicine

## 2016-08-10 VITALS — BP 142/61 | HR 99 | Temp 99.1°F

## 2016-08-10 DIAGNOSIS — M818 Other osteoporosis without current pathological fracture: Secondary | ICD-10-CM

## 2016-08-10 DIAGNOSIS — Z23 Encounter for immunization: Secondary | ICD-10-CM | POA: Diagnosis not present

## 2016-08-10 MED ORDER — DENOSUMAB 60 MG/ML ~~LOC~~ SOLN
60.0000 mg | Freq: Once | SUBCUTANEOUS | Status: AC
  Administered 2016-08-10: 60 mg via SUBCUTANEOUS

## 2016-08-10 NOTE — Progress Notes (Signed)
Subjective:    Patient ID: Darlene Lynn, female    DOB: Apr 06, 1934, 83 y.o.   MRN: [id number]  Need Flu vaccine - Darlene Lynn needs flu vaccine. Denies any problems with past vaccines.   Osteoporosis - Darlene Lynn is here for a Prolia injection. She is taking calcium daily. Calcium level within normal limits.          Component                Value               Date/Time                 NA                              141                 08/01/2016 0926           K                                4.0                 08/01/2016 0926           CL                             103                 08/01/2016 0926           CO2                          22                  08/01/2016 0926           GLUCOSE               127 (H)             08/01/2016 0926           BUN                         11                  08/01/2016 0926           CREATININE            0.73                08/01/2016 0926           CALCIUM                 10.1                08/01/2016 0926           PROT                       7.2                 08/01/2016 0926           ALBUMIN                  4.4  08/01/2016 0926           AST                          18                  08/01/2016 0926           ALT                          14                  08/01/2016 0926           ALKPHOS                62                  08/01/2016 0926           BILITOT                   0.5                 08/01/2016 0926           GFRNONAA            78                  08/01/2016 0926           GFRAA                   >89                 08/01/2016 0926           Review of Systems  Constitutional: Negative.   HENT: Negative.   Respiratory: Negative.   Cardiovascular: Negative.   Gastrointestinal: Negative.   Genitourinary: Negative.   Neurological: Negative.        Objective:   Physical Exam        Assessment & Plan:  Need flu vaccine - Patient tolerated injection well without complications. Advised to return  in 9 to 10 months for next flu vaccine.   Osteoporosis - Patient tolerated Prolia injection well without complications. Patient advised to schedule next injection in 6 months with CMP 1 week prior.

## 2016-08-11 ENCOUNTER — Other Ambulatory Visit: Payer: Self-pay | Admitting: Family Medicine

## 2016-09-05 ENCOUNTER — Other Ambulatory Visit: Payer: Self-pay | Admitting: Family Medicine

## 2016-09-13 ENCOUNTER — Other Ambulatory Visit: Payer: Self-pay | Admitting: Family Medicine

## 2016-09-13 DIAGNOSIS — I10 Essential (primary) hypertension: Secondary | ICD-10-CM

## 2016-10-25 ENCOUNTER — Ambulatory Visit: Payer: Medicare Other | Admitting: Family Medicine

## 2016-10-25 ENCOUNTER — Ambulatory Visit (INDEPENDENT_AMBULATORY_CARE_PROVIDER_SITE_OTHER): Payer: Medicare Other | Admitting: Family Medicine

## 2016-10-25 ENCOUNTER — Ambulatory Visit: Payer: Medicare Other

## 2016-10-25 VITALS — BP 131/76 | HR 96 | Wt 123.0 lb

## 2016-10-25 DIAGNOSIS — M949 Disorder of cartilage, unspecified: Secondary | ICD-10-CM | POA: Diagnosis not present

## 2016-10-25 DIAGNOSIS — I10 Essential (primary) hypertension: Secondary | ICD-10-CM

## 2016-10-25 DIAGNOSIS — E119 Type 2 diabetes mellitus without complications: Secondary | ICD-10-CM

## 2016-10-25 DIAGNOSIS — M899 Disorder of bone, unspecified: Secondary | ICD-10-CM

## 2016-10-25 DIAGNOSIS — Z0000 Encounter for general adult medical examination without abnormal findings: Secondary | ICD-10-CM

## 2016-10-25 DIAGNOSIS — Z794 Long term (current) use of insulin: Secondary | ICD-10-CM

## 2016-10-25 DIAGNOSIS — M818 Other osteoporosis without current pathological fracture: Secondary | ICD-10-CM

## 2016-10-25 MED ORDER — OMEPRAZOLE 40 MG PO CPDR
40.0000 mg | DELAYED_RELEASE_CAPSULE | Freq: Every day | ORAL | 3 refills | Status: AC
Start: 2016-10-25 — End: ?

## 2016-10-25 MED ORDER — METFORMIN HCL 1000 MG PO TABS: 1000.0000 mg | ORAL_TABLET | Freq: Two times a day (BID) | ORAL | 3 refills | Status: DC

## 2016-10-25 MED ORDER — AMLODIPINE BESYLATE 10 MG PO TABS: 10.0000 mg | ORAL_TABLET | Freq: Every day | ORAL | 3 refills | Status: DC

## 2016-10-25 NOTE — Progress Notes (Signed)
HPI: Darlene Lynn is a 83 y.o. female  who presents to [facility] today, 10/25/16,  for Medicare Annual Wellness Exam  Patient presents for annual physical/Medicare wellness exam. No complaints today.   Past medical, surgical, social and family history reviewed:  Patient Active Problem List   Diagnosis Date Noted  . DNR (do not resuscitate) 04/26/2016  . Osteoporosis 09/22/2015  . Decubital ulcer 08/14/2015  . Diabetes mellitus ([place]) 08/11/2015  . Disorder of bone and cartilage 08/11/2015  . Falls 08/11/2015  . Closed supracondylar fracture of femur ([place]) 05/12/2015  . BP (high blood pressure) 04/28/2015    No past surgical history on file.  Social History   Social History  . Marital status: Single    Spouse name: N/A  . Number of children: N/A  . Years of education: N/A   Occupational History  . Not on file.   Social History Main Topics  . Smoking status: Never Smoker  . Smokeless tobacco: Never Used  . Alcohol use No  . Drug use: No  . Sexual activity: Not on file   Other Topics Concern  . Not on file   Social History Narrative  . No narrative on file    Family History  Problem Relation Age of Onset  . Hypertension Mother   . Diabetes Father   . Hypertension Father   . Diabetes Sister   . Hypertension Sister   . Diabetes Brother      Current medication list and allergy/intolerance information reviewed:    Outpatient Encounter Prescriptions as of 10/25/2016  Medication Sig  . amLODipine (NORVASC) 10 MG tablet Take 1 tablet (10 mg total) by mouth daily.  Marland Kitchen atorvastatin (LIPITOR) 40 MG tablet TAKE ONE TABLET BY MOUTH ONCE DAILY  . Cholecalciferol (VITAMIN D3) 2000 units TABS Take 1 tablet by mouth daily.  Marland Kitchen denosumab (PROLIA) 60 MG/ML SOLN injection Inject 60 mg into the skin every 6 (six) months. Administer in upper arm, thigh, or abdomen  . lisinopril (PRINIVIL,ZESTRIL) 10 MG tablet TAKE ONE TABLET BY MOUTH  ONCE DAILY  . metFORMIN (GLUCOPHAGE) 1000 MG tablet Take 1 tablet (1,000 mg total) by mouth 2 (two) times daily with a meal.  . Multiple Vitamins-Minerals (DECUBI-VITE) CAPS Take 1 capsule by mouth daily.  Marland Kitchen omeprazole (PRILOSEC) 40 MG capsule Take 1 capsule (40 mg total) by mouth daily.  . [DISCONTINUED] amLODipine (NORVASC) 10 MG tablet TAKE ONE TABLET BY MOUTH ONCE DAILY  . [DISCONTINUED] esomeprazole (NEXIUM) 40 MG capsule Take 1 capsule (40 mg total) by mouth daily.  . [DISCONTINUED] metFORMIN (GLUCOPHAGE) 1000 MG tablet TAKE ONE TABLET BY MOUTH TWICE DAILY WITH MEALS   No facility-administered encounter medications on file as of 10/25/2016.     Allergies  Allergen Reactions  . Iodinated Diagnostic Agents Hives and Itching  . Iodine Itching and Rash  . Codeine Nausea And Vomiting  . Tape Dermatitis       Review of Systems: No headache, visual changes, nausea, vomiting, diarrhea, constipation, dizziness, abdominal pain, skin rash, fevers, chills, night sweats, weight loss, swollen lymph nodes, body aches, joint swelling, muscle aches, chest pain, shortness of breath, mood changes, visual or auditory hallucinations.     Medicare Wellness Questionnaire  Are there smokers in your home (other than you)? no  Depression Screen (Note: if answer to either of the following is "Yes", a more complete depression screening is indicated)   Q1: Over the past two weeks, have you felt down, depressed  or hopeless? no  Q2: Over the past two weeks, have you felt little interest or pleasure in doing things? no  Have you lost interest or pleasure in daily life? no  Do you often feel hopeless? no  Do you cry easily over simple problems? no  Activities of Daily Living In your present state of health, do you have any difficulty performing the following activities?:  Driving? no Managing money?  no Feeding yourself? no Getting from bed to chair? no Climbing a flight of stairs? no Preparing food  and eating?: no Bathing or showering? no Getting dressed: no Getting to the toilet? no Using the toilet: no Moving around from place to place: no In the past year have you fallen or had a near fall?: no  Hearing Difficulties:  Do you often ask people to speak up or repeat themselves? no Do you experience ringing or noises in your ears? no  Do you have difficulty understanding soft or whispered voices? no  Memory Difficulties:  Do you feel that you have a problem with memory? no  Do you often misplace items? no  Do you feel safe at home?  yes  Sexual Health:   Are you sexually active?  Yes  Do you have more than one partner?  No  Advanced Directives:   Advanced directives discussed: DNI/DNR  Additional information provided: no  Risk Factors  Current exercise habits: Discussed  Dietary issues discussed: High calcium and adequate calorie intake  Cardiac risk factors: HTN, DM, HLD   Exam:  BP 131/76   Pulse 96   Wt 123 lb (55.8 kg)   BMI 24.02 kg/m  Vision by Snellen chart: right eye:see nurse notes, left eye:see nurse notes  Constitutional: VS see above. General Appearance: alert, well-developed, well-nourished, NAD  Ears, Nose, Mouth, Throat: MMM  Neck: No masses, trachea midline.   Respiratory: Normal respiratory effort. no wheeze, no rhonchi, no rales  Cardiovascular:No lower extremity edema.   Musculoskeletal: Gait normal. No clubbing/cyanosis of digits.   Neurological: Normal balance/coordination. No tremor. Recalls 3 objects and able to read face of watch with correct time.   Skin: warm, dry, intact. No rash/ulcer.   Psychiatric: Normal judgment/insight. Normal mood and affect. Oriented x3.     ASSESSMENT/PLAN:   Encounter for Medicare annual wellness exam  Disorder of bone and cartilage - Plan: CBC, COMPLETE METABOLIC PANEL WITH GFR, Lipid Panel w/reflex Direct LDL, Hemoglobin A1c, VITAMIN D 25 Hydroxy (Vit-D Deficiency, Fractures)  Other  osteoporosis without current pathological fracture - Plan: CBC, COMPLETE METABOLIC PANEL WITH GFR, Lipid Panel w/reflex Direct LDL, Hemoglobin A1c, VITAMIN D 25 Hydroxy (Vit-D Deficiency, Fractures)  Type 2 diabetes mellitus without complication, with long-term current use of insulin (HCC) - Plan: CBC, COMPLETE METABOLIC PANEL WITH GFR, Lipid Panel w/reflex Direct LDL, Hemoglobin A1c, VITAMIN D 25 Hydroxy (Vit-D Deficiency, Fractures)  Essential hypertension - Plan: CBC, COMPLETE METABOLIC PANEL WITH GFR, Lipid Panel w/reflex Direct LDL, Hemoglobin A1c, VITAMIN D 25 Hydroxy (Vit-D Deficiency, Fractures)    Immunization History  Administered Date(s) Administered  . Influenza,inj,Quad PF,36+ Mos 08/10/2016  . Influenza-Unspecified 05/01/2015  . Pneumococcal Conjugate-13 04/26/2016  . Pneumococcal Polysaccharide-23 05/01/2015  . Tdap 10/22/2015  . Zoster 09/22/2015   OTHER  Fall - Already done  Advanced Directives -  Discussed as above   During the course of the visit the patient was educated and counseled about appropriate screening and preventive services as noted above.   Patient Instructions (the written plan) was given  to the patient.  Medicare Attestation I have personally reviewed: The patient's medical and social history Their use of alcohol, tobacco or illicit drugs Their current medications and supplements The patient's functional ability including ADLs,fall risks, home safety risks, cognitive, and hearing and visual impairment Diet and physical activities Evidence for depression or mood disorders  The patient's weight, height, BMI, and visual acuity have been recorded in the chart.  I have made referrals, counseling, and provided education to the patient based on review of the above and I have provided the patient with a written personalized care plan for preventive services.      Visit summary with medication list and pertinent instructions  printed for patient to  review. All questions at time of visit were answered - patient instructed to contact office with any additional concerns. ER/RTC precautions were reviewed with the patient. Follow-up plan:

## 2016-10-25 NOTE — Patient Instructions (Signed)
Thank you for coming in today. Please get fasting labs in the near future.  Recheck in 6 months or so.   Return in 6 months or sooner if needed.   Call or go to the emergency room if you get worse, have trouble breathing, have chest pains, or palpitations.

## 2016-10-26 DIAGNOSIS — E119 Type 2 diabetes mellitus without complications: Secondary | ICD-10-CM | POA: Diagnosis not present

## 2016-10-26 DIAGNOSIS — M949 Disorder of cartilage, unspecified: Secondary | ICD-10-CM | POA: Diagnosis not present

## 2016-10-26 DIAGNOSIS — I10 Essential (primary) hypertension: Secondary | ICD-10-CM | POA: Diagnosis not present

## 2016-10-26 DIAGNOSIS — M818 Other osteoporosis without current pathological fracture: Secondary | ICD-10-CM | POA: Diagnosis not present

## 2016-10-26 DIAGNOSIS — Z794 Long term (current) use of insulin: Secondary | ICD-10-CM | POA: Diagnosis not present

## 2016-10-26 DIAGNOSIS — M899 Disorder of bone, unspecified: Secondary | ICD-10-CM | POA: Diagnosis not present

## 2016-10-26 LAB — CBC
HCT: 39.8 % (ref 35.0–45.0)
HEMOGLOBIN: 13 g/dL (ref 11.7–15.5)
MCH: 28 pg (ref 27.0–33.0)
MCHC: 32.7 g/dL (ref 32.0–36.0)
MCV: 85.8 fL (ref 80.0–100.0)
MPV: 10.3 fL (ref 7.5–12.5)
PLATELETS: 298 10*3/uL (ref 140–400)
RBC: 4.64 MIL/uL (ref 3.80–5.10)
RDW: 14.7 % (ref 11.0–15.0)
WBC: 7.5 10*3/uL (ref 3.8–10.8)

## 2016-10-26 LAB — COMPLETE METABOLIC PANEL WITH GFR
ALBUMIN: 4.2 g/dL (ref 3.6–5.1)
ALT: 18 U/L (ref 6–29)
AST: 21 U/L (ref 10–35)
Alkaline Phosphatase: 54 U/L (ref 33–130)
BILIRUBIN TOTAL: 0.6 mg/dL (ref 0.2–1.2)
BUN: 8 mg/dL (ref 7–25)
CO2: 22 mmol/L (ref 20–31)
CREATININE: 0.71 mg/dL (ref 0.60–0.88)
Calcium: 10 mg/dL (ref 8.6–10.4)
Chloride: 106 mmol/L (ref 98–110)
GFR, Est Non African American: 81 mL/min (ref >=60)
GLUCOSE: 138 mg/dL — AB (ref 65–99)
Potassium: 4.8 mmol/L (ref 3.5–5.3)
SODIUM: 143 mmol/L (ref 135–146)
TOTAL PROTEIN: 6.7 g/dL (ref 6.1–8.1)

## 2016-10-26 LAB — LIPID PANEL W/REFLEX DIRECT LDL
Cholesterol: 131 mg/dL (ref <200)
HDL: 51 mg/dL (ref >50)
LDL-Cholesterol: 59 mg/dL
Non-HDL Cholesterol (Calc): 80 mg/dL (ref <130)
Total CHOL/HDL Ratio: 2.6 Ratio (ref <5.0)
Triglycerides: 129 mg/dL (ref <150)

## 2016-10-27 LAB — VITAMIN D 25 HYDROXY (VIT D DEFICIENCY, FRACTURES): Vit D, 25-Hydroxy: 62 ng/mL (ref 30–100)

## 2016-10-27 LAB — HEMOGLOBIN A1C
Hgb A1c MFr Bld: 6.5 % — ABNORMAL HIGH (ref <5.7)
Mean Plasma Glucose: 140 mg/dL

## 2016-12-05 ENCOUNTER — Other Ambulatory Visit: Payer: Self-pay | Admitting: Family Medicine

## 2016-12-16 ENCOUNTER — Other Ambulatory Visit: Payer: Self-pay | Admitting: Family Medicine

## 2016-12-16 DIAGNOSIS — I10 Essential (primary) hypertension: Secondary | ICD-10-CM

## 2017-02-06 ENCOUNTER — Other Ambulatory Visit: Payer: Self-pay

## 2017-02-07 ENCOUNTER — Ambulatory Visit: Payer: Medicare Other

## 2017-02-07 ENCOUNTER — Other Ambulatory Visit: Payer: Self-pay | Admitting: Family Medicine

## 2017-02-07 DIAGNOSIS — M810 Age-related osteoporosis without current pathological fracture: Secondary | ICD-10-CM | POA: Diagnosis not present

## 2017-02-07 LAB — COMPLETE METABOLIC PANEL WITH GFR
ALT: 18 U/L (ref 6–29)
AST: 21 U/L (ref 10–35)
Albumin: 4.4 g/dL (ref 3.6–5.1)
Alkaline Phosphatase: 52 U/L (ref 33–130)
BUN: 9 mg/dL (ref 7–25)
CALCIUM: 10.3 mg/dL (ref 8.6–10.4)
CHLORIDE: 103 mmol/L (ref 98–110)
CO2: 22 mmol/L (ref 20–31)
Creat: 0.65 mg/dL (ref 0.60–0.88)
GFR, Est Non African American: 84 mL/min (ref >=60)
Glucose, Bld: 144 mg/dL — ABNORMAL HIGH (ref 65–99)
POTASSIUM: 4.3 mmol/L (ref 3.5–5.3)
Sodium: 138 mmol/L (ref 135–146)
Total Bilirubin: 0.5 mg/dL (ref 0.2–1.2)
Total Protein: 7.2 g/dL (ref 6.1–8.1)

## 2017-02-09 ENCOUNTER — Ambulatory Visit (INDEPENDENT_AMBULATORY_CARE_PROVIDER_SITE_OTHER): Payer: Medicare Other | Admitting: Family Medicine

## 2017-02-09 VITALS — BP 139/60 | HR 95 | Resp 16 | Wt 121.9 lb

## 2017-02-09 DIAGNOSIS — M818 Other osteoporosis without current pathological fracture: Secondary | ICD-10-CM | POA: Diagnosis not present

## 2017-02-09 MED ORDER — DENOSUMAB 60 MG/ML ~~LOC~~ SOLN
60.0000 mg | Freq: Once | SUBCUTANEOUS | Status: AC
  Administered 2017-02-09: 60 mg via SUBCUTANEOUS

## 2017-02-09 NOTE — Progress Notes (Signed)
Pt is here for Prolia injection. Pt reports taking calcium and Vit D daily. Pt's calcium and kidney function was within normal limits. Pt tolerated injection well into   Without complications. Pt advised to return in 6 months for next injection.

## 2017-03-07 ENCOUNTER — Other Ambulatory Visit: Payer: Self-pay | Admitting: Family Medicine

## 2017-03-16 ENCOUNTER — Other Ambulatory Visit: Payer: Self-pay | Admitting: Family Medicine

## 2017-03-16 DIAGNOSIS — I10 Essential (primary) hypertension: Secondary | ICD-10-CM

## 2017-04-25 ENCOUNTER — Encounter: Payer: Self-pay | Admitting: Family Medicine

## 2017-04-25 ENCOUNTER — Ambulatory Visit (INDEPENDENT_AMBULATORY_CARE_PROVIDER_SITE_OTHER): Payer: Medicare Other | Admitting: Family Medicine

## 2017-04-25 VITALS — BP 152/71 | HR 94 | Wt 122.0 lb

## 2017-04-25 DIAGNOSIS — M899 Disorder of bone, unspecified: Secondary | ICD-10-CM | POA: Diagnosis not present

## 2017-04-25 DIAGNOSIS — M818 Other osteoporosis without current pathological fracture: Secondary | ICD-10-CM | POA: Diagnosis not present

## 2017-04-25 DIAGNOSIS — M949 Disorder of cartilage, unspecified: Secondary | ICD-10-CM

## 2017-04-25 DIAGNOSIS — Z794 Long term (current) use of insulin: Secondary | ICD-10-CM

## 2017-04-25 DIAGNOSIS — Z23 Encounter for immunization: Secondary | ICD-10-CM

## 2017-04-25 DIAGNOSIS — I10 Essential (primary) hypertension: Secondary | ICD-10-CM | POA: Diagnosis not present

## 2017-04-25 DIAGNOSIS — E119 Type 2 diabetes mellitus without complications: Secondary | ICD-10-CM | POA: Diagnosis not present

## 2017-04-25 LAB — POCT GLYCOSYLATED HEMOGLOBIN (HGB A1C): Hemoglobin A1C: 6.6

## 2017-04-25 MED ORDER — ATORVASTATIN CALCIUM 40 MG PO TABS: 40.0000 mg | ORAL_TABLET | Freq: Every day | ORAL | 0 refills | Status: DC

## 2017-04-25 MED ORDER — LISINOPRIL 10 MG PO TABS: 10.0000 mg | ORAL_TABLET | Freq: Every day | ORAL | 0 refills | Status: DC

## 2017-04-25 MED ORDER — LISINOPRIL 20 MG PO TABS: 20.0000 mg | ORAL_TABLET | Freq: Every day | ORAL | 0 refills | Status: DC

## 2017-04-25 NOTE — Patient Instructions (Addendum)
Thank you for coming in today. Increase lisinopril to 20mg  daily.  Recheck with me in 3 months to recheck blood pressure, diabetes and prolia shot.  Call or go to the emergency room if you get worse, have trouble breathing, have chest pains, or palpitations.  You should hear from the eye doctor soon about the diabetic eye exam.  Recheck with me sooner if needed.   Lisinopril tablets What is this medicine? LISINOPRIL (lyse [place] pril) is an ACE inhibitor. This medicine is used to treat high blood pressure and heart failure. It is also used to protect the heart immediately after a heart attack. This medicine may be used for other purposes; ask your health care provider or pharmacist if you have questions. COMMON BRAND NAME(S): Prinivil, Zestril What should I tell my health care provider before I take this medicine? They need to know if you have any of these conditions: -diabetes -heart or blood vessel disease -kidney disease -low blood pressure -previous swelling of the tongue, face, or lips with difficulty breathing, difficulty swallowing, hoarseness, or tightening of the throat -an unusual or allergic reaction to lisinopril, other ACE inhibitors, insect venom, foods, dyes, or preservatives -pregnant or trying to get pregnant -breast-feeding How should I use this medicine? Take this medicine by mouth with a glass of water. Follow the directions on your prescription label. You may take this medicine with or without food. If it upsets your stomach, take it with food. Take your medicine at regular intervals. Do not take it more often than directed. Do not stop taking except on your doctor's advice. Talk to your pediatrician regarding the use of this medicine in children. Special care may be needed. While this drug may be prescribed for children as young as 99 years of age for selected conditions, precautions do apply. Overdosage: If you think you have taken too much of this medicine contact a  poison control center or emergency room at once. NOTE: This medicine is only for you. Do not share this medicine with others. What if I miss a dose? If you miss a dose, take it as soon as you can. If it is almost time for your next dose, take only that dose. Do not take double or extra doses. What may interact with this medicine? Do not take this medicine with any of the following medications: -hymenoptera venom -sacubitril; valsartan This medicines may also interact with the following medications: -aliskiren -angiotensin receptor blockers, like losartan or valsartan -certain medicines for diabetes -diuretics -everolimus -gold compounds -lithium -NSAIDs, medicines for pain and inflammation, like ibuprofen or naproxen -potassium salts or supplements -salt substitutes -sirolimus -temsirolimus This list may not describe all possible interactions. Give your health care provider a list of all the medicines, herbs, non-prescription drugs, or dietary supplements you use. Also tell them if you smoke, drink alcohol, or use illegal drugs. Some items may interact with your medicine. What should I watch for while using this medicine? Visit your doctor or health care professional for regular check ups. Check your blood pressure as directed. Ask your doctor what your blood pressure should be, and when you should contact him or her. Do not treat yourself for coughs, colds, or pain while you are using this medicine without asking your doctor or health care professional for advice. Some ingredients may increase your blood pressure. Women should inform their doctor if they wish to become pregnant or think they might be pregnant. There is a potential for serious side effects to an  unborn child. Talk to your health care professional or pharmacist for more information. Check with your doctor or health care professional if you get an attack of severe diarrhea, nausea and vomiting, or if you sweat a lot. The loss  of too much body fluid can make it dangerous for you to take this medicine. You may get drowsy or dizzy. Do not drive, use machinery, or do anything that needs mental alertness until you know how this drug affects you. Do not stand or sit up quickly, especially if you are an older patient. This reduces the risk of dizzy or fainting spells. Alcohol can make you more drowsy and dizzy. Avoid alcoholic drinks. Avoid salt substitutes unless you are told otherwise by your doctor or health care professional. What side effects may I notice from receiving this medicine? Side effects that you should report to your doctor or health care professional as soon as possible: -allergic reactions like skin rash, itching or hives, swelling of the hands, feet, face, lips, throat, or tongue -breathing problems -signs and symptoms of kidney injury like trouble passing urine or change in the amount of urine -signs and symptoms of increased potassium like muscle weakness; chest pain; or fast, irregular heartbeat -signs and symptoms of liver injury like dark yellow or brown urine; general ill feeling or flu-like symptoms; light-colored stools; loss of appetite; nausea; right upper belly pain; unusually weak or tired; yellowing of the eyes or skin -signs and symptoms of low blood pressure like dizziness; feeling faint or lightheaded, falls; unusually weak or tired -stomach pain with or without nausea and vomiting Side effects that usually do not require medical attention (report to your doctor or health care professional if they continue or are bothersome): -changes in taste -cough -dizziness -fever -headache -sensitivity to light This list may not describe all possible side effects. Call your doctor for medical advice about side effects. You may report side effects to FDA at 1-800-FDA-1088. Where should I keep my medicine? Keep out of the reach of children. Store at room temperature between 15 and 30 degrees C (59 and  86 degrees F). Protect from moisture. Keep container tightly closed. Throw away any unused medicine after the expiration date. NOTE: This sheet is a summary. It may not cover all possible information. If you have questions about this medicine, talk to your doctor, pharmacist, or health care provider.  2018 Elsevier/Gold Standard (2015-08-24 12:52:35)

## 2017-04-25 NOTE — Progress Notes (Signed)
Darlene Lynn is a 83 y.o. female who presents to [facility]: [place] today for follow-up diabetes hypertension hyperlipidemia and osteoporosis.  Diabetes: Darlene Lynn takes thousand milligrams of metformin twice daily. She tolerates the medication well. She does not check her blood sugars regularly. She denies any polyuria or polydipsia.  Hypertension: Darlene Lynn takes amlodipine 10 mg daily and lisinopril 10 mg daily. She denies chest pain palpitations or shortness of breath. She does not check her blood pressure regularly. She denies any lightheadedness or dizziness.  Osteoporosis: Darlene Lynn gets Prolia injections every 6 months. This seems to be working well to help control her fracture risk. She is feeling well and tolerates his medications with no issues.   Past Medical History:  Diagnosis Date  . Diabetes mellitus without complication ([place])   . Hypertension    No past surgical history on file. Social History  Substance Use Topics  . Smoking status: Never Smoker  . Smokeless tobacco: Never Used  . Alcohol use No   family history includes Diabetes in her brother, father, and sister; Hypertension in her father, mother, and sister.  ROS as above:  Medications: Current Outpatient Prescriptions  Medication Sig Dispense Refill  . amLODipine (NORVASC) 10 MG tablet Take 1 tablet (10 mg total) by mouth daily. 90 tablet 3  . Cholecalciferol (VITAMIN D3) 2000 units TABS Take 1 tablet by mouth daily. 90 tablet 1  . denosumab (PROLIA) 60 MG/ML SOLN injection Inject 60 mg into the skin every 6 (six) months. Administer in upper arm, thigh, or abdomen    . metFORMIN (GLUCOPHAGE) 1000 MG tablet Take 1 tablet (1,000 mg total) by mouth 2 (two) times daily with a meal. 180 tablet 3  . Multiple Vitamins-Minerals (DECUBI-VITE) CAPS Take 1 capsule by mouth daily. 90 capsule 5  . omeprazole  (PRILOSEC) 40 MG capsule Take 1 capsule (40 mg total) by mouth daily. 90 capsule 3  . atorvastatin (LIPITOR) 40 MG tablet Take 1 tablet (40 mg total) by mouth daily. 90 tablet 0  . lisinopril (PRINIVIL,ZESTRIL) 20 MG tablet Take 1 tablet (20 mg total) by mouth daily. Replaces 10mg  dose 90 tablet 0   No current facility-administered medications for this visit.    Allergies  Allergen Reactions  . Iodinated Diagnostic Agents Hives and Itching  . Iodine Itching and Rash  . Codeine Nausea And Vomiting  . Tape Dermatitis    Health Maintenance Health Maintenance  Topic Date Due  . OPHTHALMOLOGY EXAM  11/14/1943  . HEMOGLOBIN A1C  10/24/2017  . FOOT EXAM  04/25/2018  . TETANUS/TDAP  10/21/2025  . INFLUENZA VACCINE  Completed  . PNA vac Low Risk Adult  Completed     Exam:  BP (!) 152/71   Pulse 94   Wt 122 lb (55.3 kg)   BMI 23.83 kg/m  Gen: Well NAD HEENT: EOMI,  MMM Lungs: Normal work of breathing. CTABL Heart: RRR no MRG Abd: NABS, Soft. Nondistended, Nontender Exts: Brisk capillary refill, warm and well perfused.  Diabetic Foot Exam - Simple   Simple Foot Form Diabetic Foot exam was performed with the following findings:  Yes 04/25/2017 10:17 AM  Visual Inspection No deformities, no ulcerations, no other skin breakdown bilaterally:  Yes Sensation Testing Pulse Check Posterior Tibialis and Dorsalis pulse intact bilaterally:  Yes Comments       Results for orders placed or performed in visit on 04/25/17 (from the past 72 hour(s))  POCT HgB A1C  Status: None   Collection Time: 04/25/17 10:33 AM  Result Value Ref Range   Hemoglobin A1C 6.6    No results found.    Assessment and Plan: 83 y.o. female with  Diabetes doing well. Plan to continue current regimen. Refer to ophthalmology for diabetic eye exam. Recheck in 3 months.  Hypertension: Blood pressure not at goal today. Plan to increase lisinopril to 20 mg and recheck in 3 months. We'll recheck metabolic  panel at that time.  Osteoporosis: Doing reasonably well with Prolia. Patient is due for repeat Prolia injection at the end of January. We'll check metabolic panel including calcium and vitamin D before the next injection at the next follow-up visit.  Hyperlipidemia: Paternal refill today. Recheck lipids at the next visit.  Influenza vaccine given today.   Orders Placed This Encounter  Procedures  . Flu vaccine HIGH DOSE PF  . Ambulatory referral to Ophthalmology    Referral Priority:   Routine    Referral Type:   Consultation    Referral Reason:   Specialty Services Required    Requested Specialty:   Ophthalmology    Number of Visits Requested:   1  . POCT HgB A1C   Meds ordered this encounter  Medications  . atorvastatin (LIPITOR) 40 MG tablet    Sig: Take 1 tablet (40 mg total) by mouth daily.    Dispense:  90 tablet    Refill:  0  . DISCONTD: lisinopril (PRINIVIL,ZESTRIL) 10 MG tablet    Sig: Take 1 tablet (10 mg total) by mouth daily.    Dispense:  90 tablet    Refill:  0  . lisinopril (PRINIVIL,ZESTRIL) 20 MG tablet    Sig: Take 1 tablet (20 mg total) by mouth daily. Replaces 10mg  dose    Dispense:  90 tablet    Refill:  0     Discussed warning signs or symptoms. Please see discharge instructions. Patient expresses understanding.

## 2017-07-19 ENCOUNTER — Other Ambulatory Visit: Payer: Self-pay | Admitting: Family Medicine

## 2017-07-19 DIAGNOSIS — I10 Essential (primary) hypertension: Secondary | ICD-10-CM

## 2017-07-19 DIAGNOSIS — M818 Other osteoporosis without current pathological fracture: Secondary | ICD-10-CM | POA: Diagnosis not present

## 2017-07-19 LAB — COMPLETE METABOLIC PANEL WITH GFR
AG Ratio: 1.7 (calc) (ref 1.0–2.5)
ALT: 21 U/L (ref 6–29)
AST: 26 U/L (ref 10–35)
Albumin: 4.4 g/dL (ref 3.6–5.1)
Alkaline phosphatase (APISO): 52 U/L (ref 33–130)
BILIRUBIN TOTAL: 0.5 mg/dL (ref 0.2–1.2)
BUN: 8 mg/dL (ref 7–25)
CALCIUM: 10.8 mg/dL — AB (ref 8.6–10.4)
CO2: 27 mmol/L (ref 20–32)
CREATININE: 0.76 mg/dL (ref 0.60–0.88)
Chloride: 104 mmol/L (ref 98–110)
GFR, EST NON AFRICAN AMERICAN: 74 mL/min/{1.73_m2} (ref >=60)
GFR, Est African American: 85 mL/min/{1.73_m2} (ref >=60)
GLUCOSE: 158 mg/dL — AB (ref 65–99)
Globulin: 2.6 g/dL (calc) (ref 1.9–3.7)
Potassium: 4.4 mmol/L (ref 3.5–5.3)
Sodium: 139 mmol/L (ref 135–146)
TOTAL PROTEIN: 7 g/dL (ref 6.1–8.1)

## 2017-07-19 NOTE — Progress Notes (Signed)
Pt needs labs for Prolia injection.

## 2017-07-26 ENCOUNTER — Encounter: Payer: Self-pay | Admitting: Family Medicine

## 2017-07-26 ENCOUNTER — Ambulatory Visit (INDEPENDENT_AMBULATORY_CARE_PROVIDER_SITE_OTHER): Payer: Medicare Other | Admitting: Family Medicine

## 2017-07-26 VITALS — BP 145/75 | HR 105 | Ht 60.0 in | Wt 119.0 lb

## 2017-07-26 DIAGNOSIS — E119 Type 2 diabetes mellitus without complications: Secondary | ICD-10-CM | POA: Diagnosis not present

## 2017-07-26 DIAGNOSIS — Z794 Long term (current) use of insulin: Secondary | ICD-10-CM | POA: Diagnosis not present

## 2017-07-26 DIAGNOSIS — E1169 Type 2 diabetes mellitus with other specified complication: Secondary | ICD-10-CM | POA: Diagnosis not present

## 2017-07-26 DIAGNOSIS — E785 Hyperlipidemia, unspecified: Secondary | ICD-10-CM | POA: Diagnosis not present

## 2017-07-26 DIAGNOSIS — I10 Essential (primary) hypertension: Secondary | ICD-10-CM

## 2017-07-26 LAB — POCT GLYCOSYLATED HEMOGLOBIN (HGB A1C): Hemoglobin A1C: 6.7

## 2017-07-26 MED ORDER — BENAZEPRIL HCL 20 MG PO TABS: 20.0000 mg | ORAL_TABLET | Freq: Every day | ORAL | 3 refills | Status: AC

## 2017-07-26 NOTE — Progress Notes (Signed)
Darlene Lynn is a 84 y.o. female who presents to [place]: [place] today for follow-up diabetes, hypertension, hyperlipidemia.  Diabetes: Darlene Lynn takes metformin listed below.  She tolerates it well with blood sugars typically less than 130.  She denies any episodes of polyuria or polydipsia.  Hypertension: Darlene Lynn notes that her blood pressure is been elevated recently.  She currently takes lisinopril and amlodipine.  She notes her previous doctor used benazepril and she had better results with that.  She would like to switch to benazepril if possible.  Hyperlipidemia: Darlene Lynn has elevated cholesterol which is controlled with atorvastatin well without significant muscle aches or pains.   Past Medical History:  Diagnosis Date  . Diabetes mellitus without complication ([place])   . Hypertension    No past surgical history on file. Social History   Tobacco Use  . Smoking status: Never Smoker  . Smokeless tobacco: Never Used  Substance Use Topics  . Alcohol use: No    Alcohol/week: 0.0 oz   family history includes Diabetes in her brother, father, and sister; Hypertension in her father, mother, and sister.  ROS as above:  Medications: Current Outpatient Medications  Medication Sig Dispense Refill  . amLODipine (NORVASC) 10 MG tablet Take 1 tablet (10 mg total) by mouth daily. 90 tablet 3  . atorvastatin (LIPITOR) 40 MG tablet Take 1 tablet (40 mg total) by mouth daily. 90 tablet 0  . Cholecalciferol (VITAMIN D3) 2000 units TABS Take 1 tablet by mouth daily. 90 tablet 1  . denosumab (PROLIA) 60 MG/ML SOLN injection Inject 60 mg into the skin every 6 (six) months. Administer in upper arm, thigh, or abdomen    . lisinopril (PRINIVIL,ZESTRIL) 20 MG tablet Take 1 tablet (20 mg total) by mouth daily. Replaces 10mg  dose 90 tablet 0  . metFORMIN (GLUCOPHAGE) 1000 MG tablet Take  1 tablet (1,000 mg total) by mouth 2 (two) times daily with a meal. 180 tablet 3  . Multiple Vitamins-Minerals (DECUBI-VITE) CAPS Take 1 capsule by mouth daily. 90 capsule 5  . omeprazole (PRILOSEC) 40 MG capsule Take 1 capsule (40 mg total) by mouth daily. 90 capsule 3   No current facility-administered medications for this visit.    Allergies  Allergen Reactions  . Iodinated Diagnostic Agents Hives and Itching  . Iodine Itching and Rash  . Codeine Nausea And Vomiting  . Tape Dermatitis    Health Maintenance Health Maintenance  Topic Date Due  . OPHTHALMOLOGY EXAM  11/14/1943  . HEMOGLOBIN A1C  10/24/2017  . FOOT EXAM  04/25/2018  . TETANUS/TDAP  10/21/2025  . INFLUENZA VACCINE  Completed  . PNA vac Low Risk Adult  Completed     Exam:  BP (!) 145/75   Pulse (!) 105   Ht 5' (1.524 m)   Wt 119 lb (54 kg)   BMI 23.24 kg/m  Gen: Well NAD HEENT: EOMI,  MMM Lungs: Normal work of breathing. CTABL Heart: RRR no MRG Abd: NABS, Soft. Nondistended, Nontender Exts: Brisk capillary refill, warm and well perfused.    Results for orders placed or performed in visit on 07/26/17 (from the past 72 hour(s))  POCT HgB A1C     Status: None   Collection Time: 07/26/17 10:27 AM  Result Value Ref Range   Hemoglobin A1C 6.7    No results found.    Assessment and Plan: 84 y.o. female with  103s: Doing well continue current regimen recheck in 3  months.  Hypertension: Elevated.  Switch to benazepril.  Recheck in 3 months.  Hyperlipidemia doing well continue current regimen and recheck lipids in the near future.   Orders Placed This Encounter  Procedures  . POCT HgB A1C   No orders of the defined types were placed in this encounter.    Discussed warning signs or symptoms. Please see discharge instructions. Patient expresses understanding.

## 2017-07-26 NOTE — Patient Instructions (Addendum)
Thank you for coming in today. Please schedule an eye exam.  Recheck in 3 months  Return sooner if needed.   STOP lisinopril START benazepril.

## 2017-07-31 IMAGING — US US EXTREM LOW VENOUS*L*
1 series · 13 of 24 positions shown · non-contrast
Comparison: None.

CLINICAL DATA: Left foot swelling and pain for 2 days.



[Series 1: us extrem low venous*left* · 0.08mm/px · 13 of 36 slices shown]
[im 1/36]
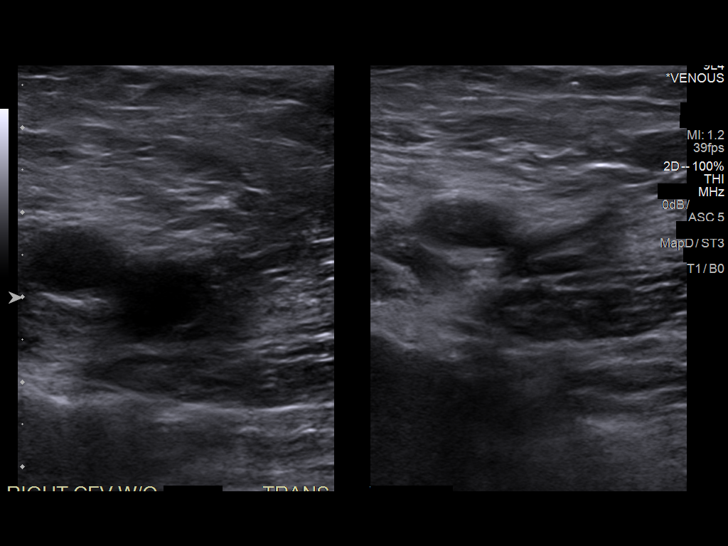
[im 4/36]
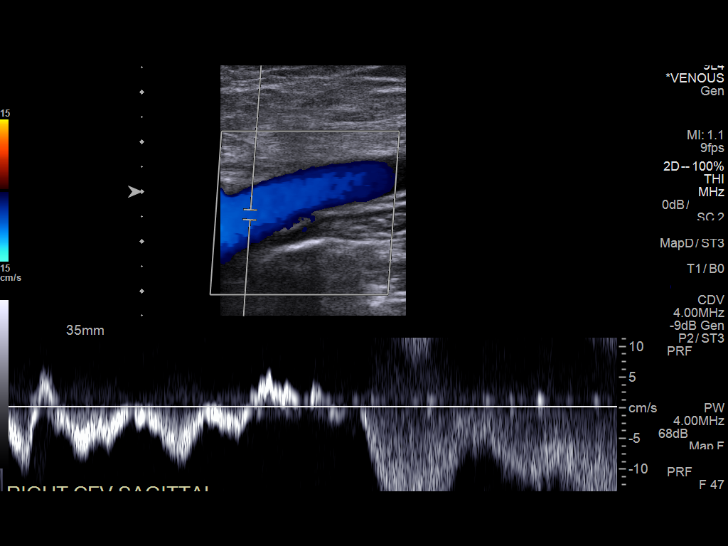
[im 7/36]
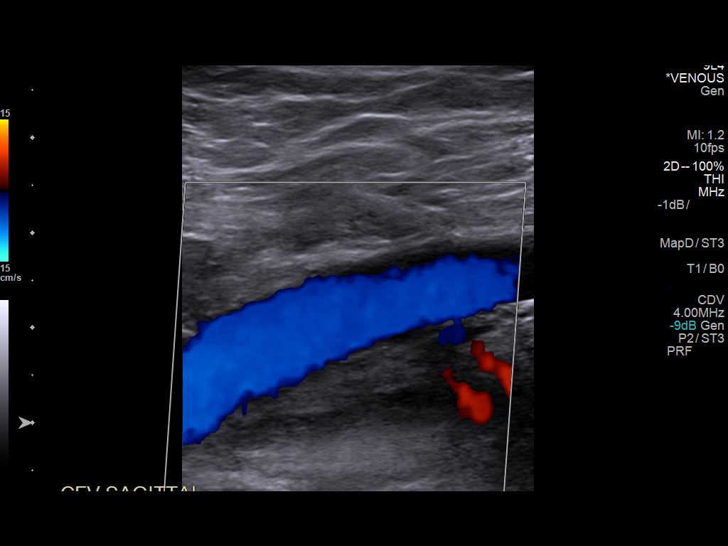
[im 10/36]
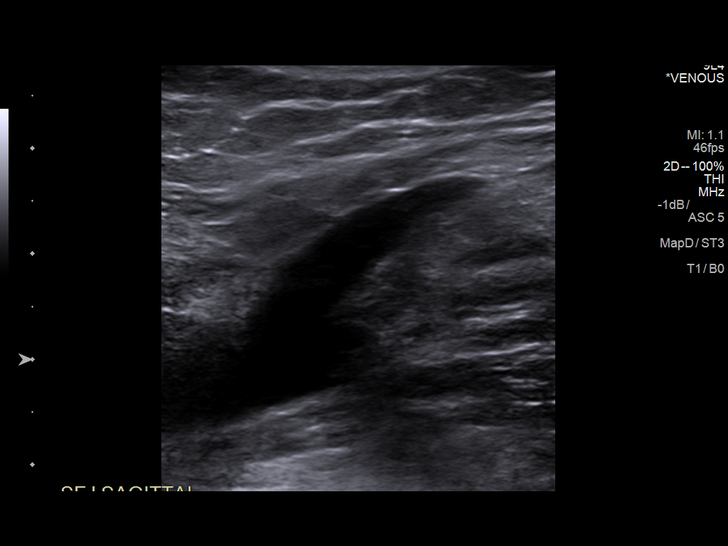
[im 13/36]
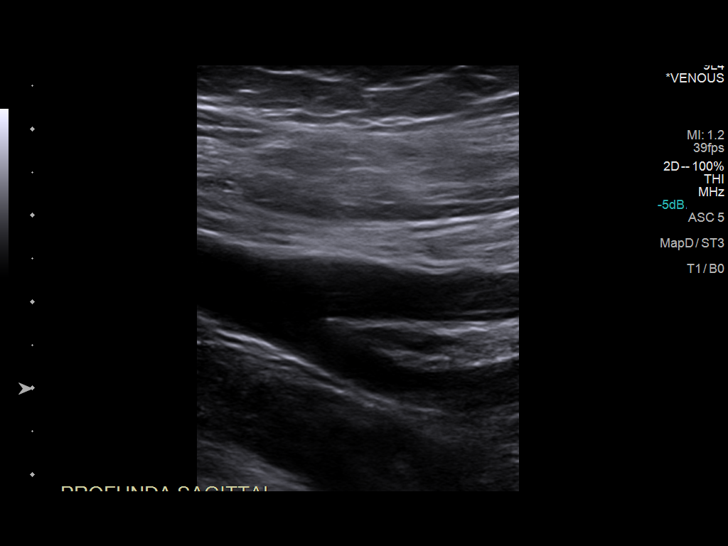
[im 16/36]
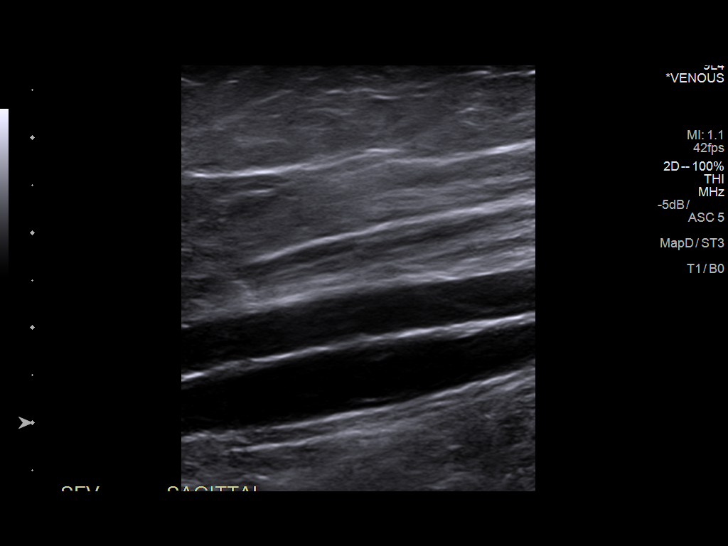
[im 19/36]
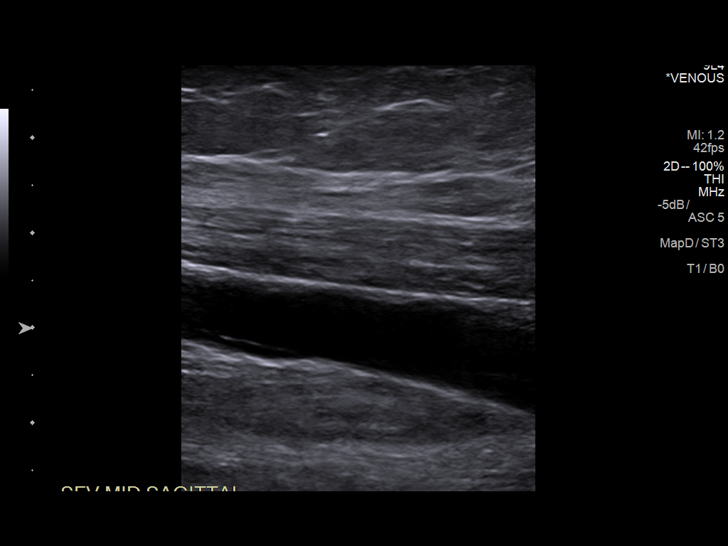
[im 20/36]
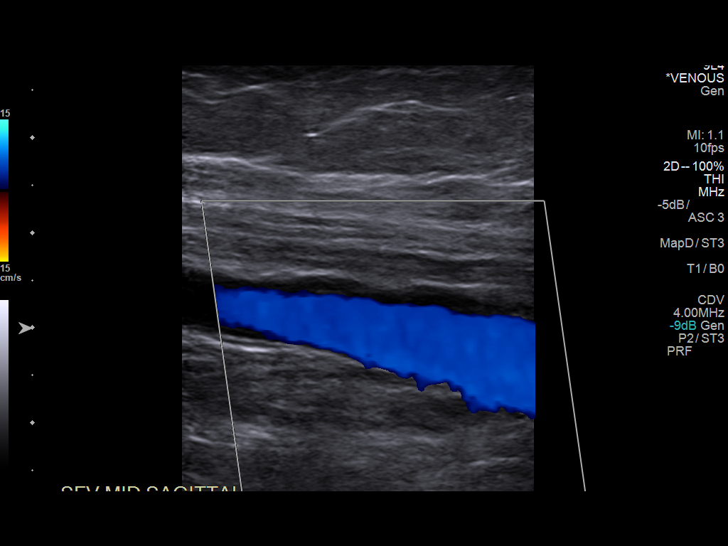
[im 23/36]
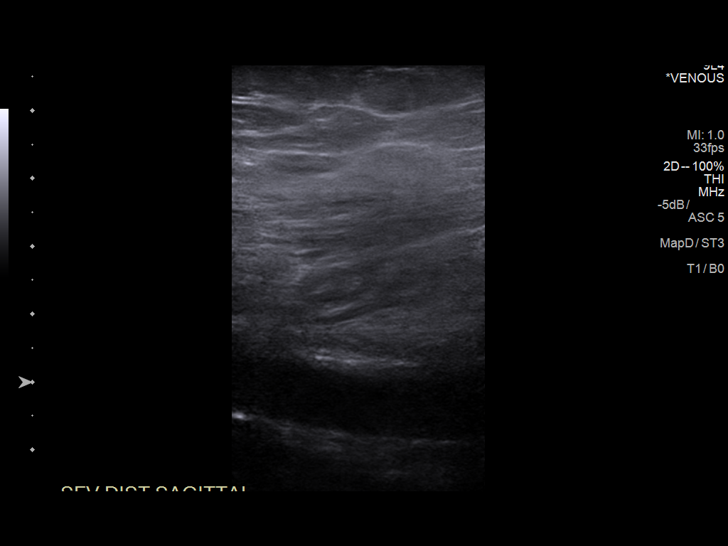
[im 26/36]
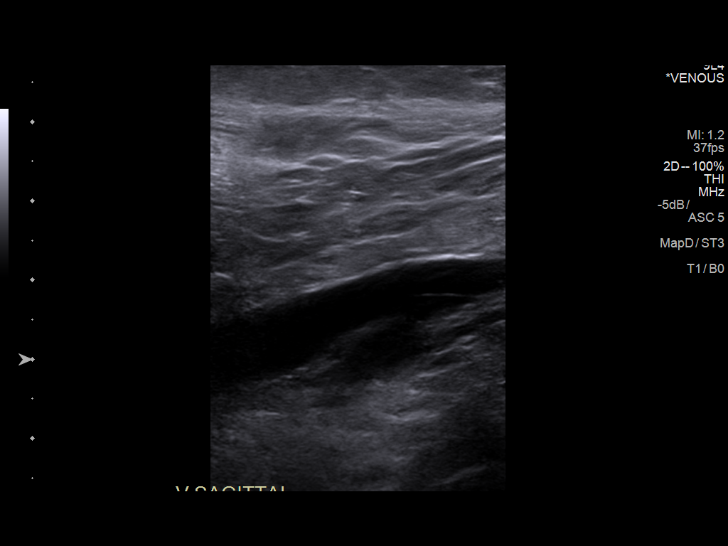
[im 29/36]
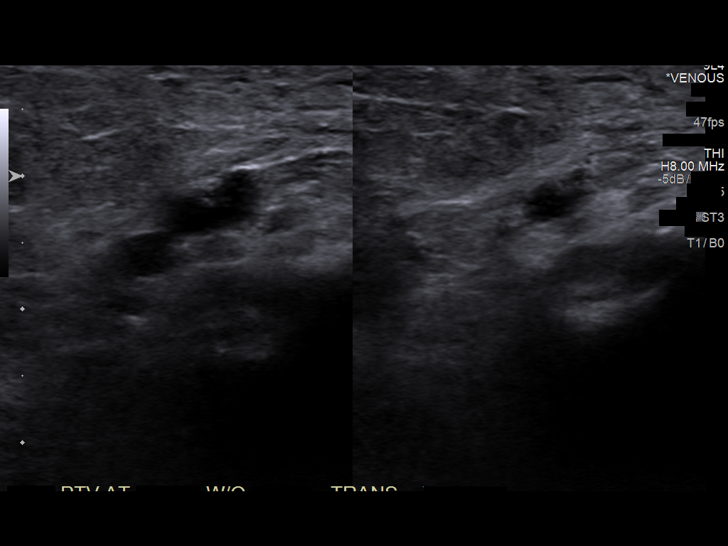
[im 32/36]
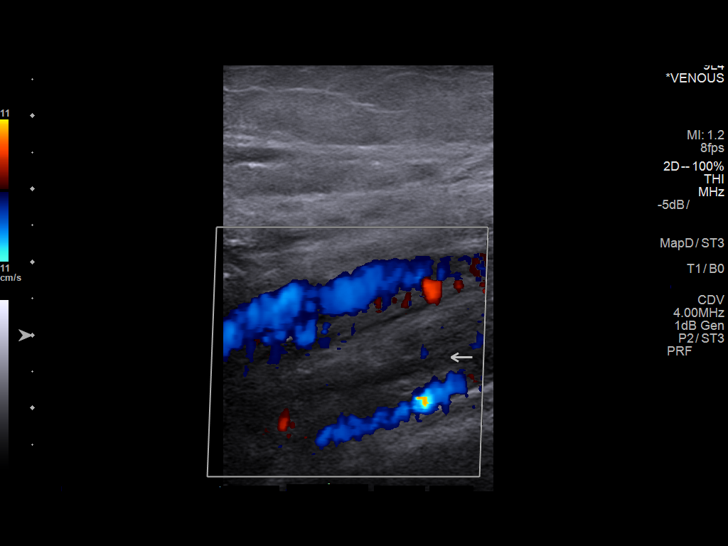
[im 36/36]
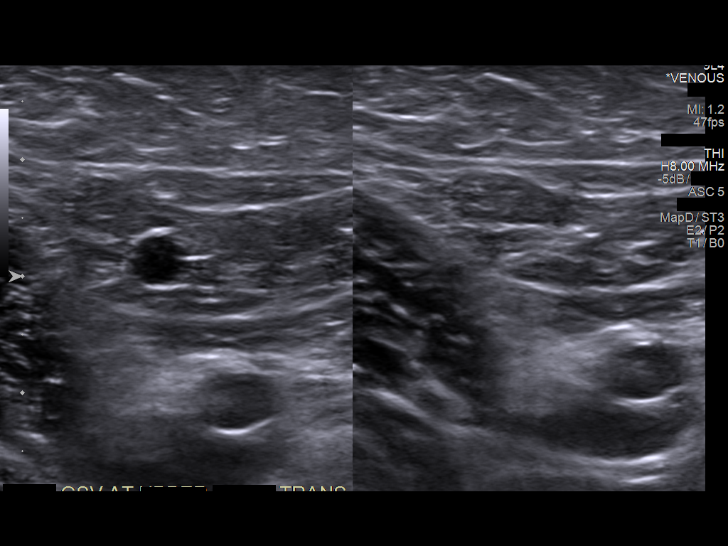

[13 of 24 positions shown; findings below may reference images not displayed]

FINDINGS: Contralateral Common Femoral Vein: Respiratory phasicity is normal
and symmetric with the symptomatic side. No evidence of thrombus.
Normal compressibility.

Common Femoral Vein: No evidence of thrombus. Normal
compressibility, respiratory phasicity and response to augmentation.

Saphenofemoral Junction: No evidence of thrombus. Normal
compressibility and flow on color Doppler imaging.

Profunda Femoral Vein: No evidence of thrombus. Normal
compressibility and flow on color Doppler imaging.

Femoral Vein: No evidence of thrombus. Normal compressibility,
respiratory phasicity and response to augmentation.

Popliteal Vein: No evidence of thrombus. Normal compressibility,
respiratory phasicity and response to augmentation.

Calf Veins: Short segment of peroneal vein in the mid calf
demonstrates thrombosis.

Superficial Great Saphenous Vein: No evidence of thrombus. Normal
compressibility and flow on color Doppler imaging.

Venous Reflux:  None.

Other Findings:  None.
IMPRESSION: Thrombosis of a short segment of peroneal vein within the left mid
calf.

## 2017-08-15 ENCOUNTER — Ambulatory Visit (INDEPENDENT_AMBULATORY_CARE_PROVIDER_SITE_OTHER): Payer: Medicare Other | Admitting: Osteopathic Medicine

## 2017-08-15 VITALS — BP 131/61 | HR 92 | Wt 117.0 lb

## 2017-08-15 DIAGNOSIS — M818 Other osteoporosis without current pathological fracture: Secondary | ICD-10-CM

## 2017-08-15 MED ORDER — DENOSUMAB 60 MG/ML ~~LOC~~ SOLN
60.0000 mg | Freq: Once | SUBCUTANEOUS | Status: AC
  Administered 2017-08-15: 60 mg via SUBCUTANEOUS

## 2017-08-15 NOTE — Progress Notes (Signed)
   Subjective:    Patient ID: Darlene Lynn, female    DOB: 1933/08/05, 84 y.o.   MRN: [id number]  HPI  Shalese is here for Prolia injection for osteoporosis. She is taking Calcium and Vitamin D daily. Denies chest pain, shortness of breath or recent illness.   Review of Systems     Objective:   Physical Exam        Assessment & Plan:  Osteroporosis - Prolia - Patient tolerated injection well without complications. Patient advised to schedule next injection 6 months from today.

## 2017-09-12 ENCOUNTER — Other Ambulatory Visit: Payer: Self-pay | Admitting: Family Medicine

## 2017-10-24 ENCOUNTER — Encounter: Payer: Self-pay | Admitting: Family Medicine

## 2017-10-24 ENCOUNTER — Ambulatory Visit (INDEPENDENT_AMBULATORY_CARE_PROVIDER_SITE_OTHER): Payer: Medicare Other | Admitting: Family Medicine

## 2017-10-24 VITALS — BP 132/74 | HR 105 | Ht 60.0 in | Wt 114.0 lb

## 2017-10-24 DIAGNOSIS — M818 Other osteoporosis without current pathological fracture: Secondary | ICD-10-CM | POA: Diagnosis not present

## 2017-10-24 DIAGNOSIS — I10 Essential (primary) hypertension: Secondary | ICD-10-CM

## 2017-10-24 DIAGNOSIS — E785 Hyperlipidemia, unspecified: Secondary | ICD-10-CM | POA: Diagnosis not present

## 2017-10-24 DIAGNOSIS — M949 Disorder of cartilage, unspecified: Secondary | ICD-10-CM

## 2017-10-24 DIAGNOSIS — M899 Disorder of bone, unspecified: Secondary | ICD-10-CM | POA: Diagnosis not present

## 2017-10-24 DIAGNOSIS — Z6822 Body mass index (BMI) 22.0-22.9, adult: Secondary | ICD-10-CM

## 2017-10-24 DIAGNOSIS — E1169 Type 2 diabetes mellitus with other specified complication: Secondary | ICD-10-CM

## 2017-10-24 DIAGNOSIS — E0800 Diabetes mellitus due to underlying condition with hyperosmolarity without nonketotic hyperglycemic-hyperosmolar coma (NKHHC): Secondary | ICD-10-CM

## 2017-10-24 LAB — POCT GLYCOSYLATED HEMOGLOBIN (HGB A1C): Hemoglobin A1C: 6.7

## 2017-10-24 NOTE — Patient Instructions (Signed)
Thank you for coming in today. Get fasting labs.  Recheck with me in about 3 months.  Get your eye exam.  You should hear from a different eye doctor.

## 2017-10-24 NOTE — Progress Notes (Signed)
Darlene Lynn is a 84 y.o. female who presents to [facility]: [facility] today for follow-up diabetes hypertension lipids and osteoporosis.  Darlene Lynn is taking the medications listed below and doing well with no chest pain palpitations lightheadedness dizziness glycemia hypoglycemia.  She feels pretty well overall with no fevers or chills nausea vomiting or diarrhea.  She did not get her diabetic eye exam the last check due to her ophthalmologist not excepting Medicare. She feels pretty well overall.   Past Medical History:  Diagnosis Date  . Diabetes mellitus without complication ([facility])   . Hypertension    No past surgical history on file. Social History   Tobacco Use  . Smoking status: Never Smoker  . Smokeless tobacco: Never Used  Substance Use Topics  . Alcohol use: No    Alcohol/week: 0.0 oz   family history includes Diabetes in her brother, father, and sister; Hypertension in her father, mother, and sister.  ROS as above:  Medications: Current Outpatient Medications  Medication Sig Dispense Refill  . amLODipine (NORVASC) 10 MG tablet Take 1 tablet (10 mg total) by mouth daily. 90 tablet 3  . atorvastatin (LIPITOR) 40 MG tablet TAKE 1 TABLET BY MOUTH ONCE DAILY 90 tablet 0  . benazepril (LOTENSIN) 20 MG tablet Take 1 tablet (20 mg total) by mouth daily. 90 tablet 3  . Cholecalciferol (VITAMIN D3) 2000 units TABS Take 1 tablet by mouth daily. 90 tablet 1  . denosumab (PROLIA) 60 MG/ML SOLN injection Inject 60 mg into the skin every 6 (six) months. Administer in upper arm, thigh, or abdomen    . metFORMIN (GLUCOPHAGE) 1000 MG tablet Take 1 tablet (1,000 mg total) by mouth 2 (two) times daily with a meal. 180 tablet 3  . Multiple Vitamins-Minerals (DECUBI-VITE) CAPS Take 1 capsule by mouth daily. 90 capsule 5  . omeprazole (PRILOSEC) 40 MG capsule Take 1 capsule (40 mg  total) by mouth daily. 90 capsule 3   No current facility-administered medications for this visit.    Allergies  Allergen Reactions  . Iodinated Diagnostic Agents Hives and Itching  . Iodine Itching and Rash  . Codeine Nausea And Vomiting  . Tape Dermatitis    Health Maintenance Health Maintenance  Topic Date Due  . OPHTHALMOLOGY EXAM  11/14/1943  . HEMOGLOBIN A1C  01/23/2018  . INFLUENZA VACCINE  02/15/2018  . FOOT EXAM  04/25/2018  . TETANUS/TDAP  10/21/2025  . PNA vac Low Risk Adult  Completed     Exam:  BP 132/74   Pulse (!) 105   Ht 5' (1.524 m)   Wt 114 lb (51.7 kg)   BMI 22.26 kg/m  Gen: Well NAD HEENT: EOMI,  MMM Lungs: Normal work of breathing. CTABL Heart: no MRG minimal tachycardia present regular rhythm Abd: NABS, Soft. Nondistended, Nontender Exts: Brisk capillary refill, warm and well perfused.    Results for orders placed or performed in visit on 10/24/17 (from the past 72 hour(s))  POCT HgB A1C     Status: None   Collection Time: 10/24/17 10:06 AM  Result Value Ref Range   Hemoglobin A1C 6.7    No results found.    Assessment and Plan: 84 y.o. female with  Diabetes doing well at goal.  Continue current regimen.  Check metabolic panel.  Hypertension: Doing well at goal continue current regimen.  Fasting labs as listed below  Lipids: We will recheck lipid panel.  Continue statin.  Osteoporosis:  Receiving Prolia.  Check parathyroid hormone calcium and vitamin D.  Recheck in 3 months   Orders Placed This Encounter  Procedures  . CBC  . COMPLETE METABOLIC PANEL WITH GFR  . Lipid Panel w/reflex Direct LDL  . VITAMIN D 25 Hydroxy (Vit-D Deficiency, Fractures)  . PTH, Intact and Calcium  . Ambulatory referral to Ophthalmology    Referral Priority:   Routine    Referral Type:   Consultation    Referral Reason:   Specialty Services Required    Requested Specialty:   Ophthalmology    Number of Visits Requested:   1  . POCT HgB A1C   No  orders of the defined types were placed in this encounter.    Discussed warning signs or symptoms. Please see discharge instructions. Patient expresses understanding.

## 2017-10-25 DIAGNOSIS — E785 Hyperlipidemia, unspecified: Secondary | ICD-10-CM | POA: Diagnosis not present

## 2017-10-25 DIAGNOSIS — I10 Essential (primary) hypertension: Secondary | ICD-10-CM | POA: Diagnosis not present

## 2017-10-25 DIAGNOSIS — M949 Disorder of cartilage, unspecified: Secondary | ICD-10-CM | POA: Diagnosis not present

## 2017-10-25 DIAGNOSIS — E1169 Type 2 diabetes mellitus with other specified complication: Secondary | ICD-10-CM | POA: Diagnosis not present

## 2017-10-25 DIAGNOSIS — M899 Disorder of bone, unspecified: Secondary | ICD-10-CM | POA: Diagnosis not present

## 2017-10-25 DIAGNOSIS — M818 Other osteoporosis without current pathological fracture: Secondary | ICD-10-CM | POA: Diagnosis not present

## 2017-10-25 DIAGNOSIS — E0800 Diabetes mellitus due to underlying condition with hyperosmolarity without nonketotic hyperglycemic-hyperosmolar coma (NKHHC): Secondary | ICD-10-CM | POA: Diagnosis not present

## 2017-10-26 LAB — CBC
HCT: 36.7 % (ref 35.0–45.0)
Hemoglobin: 12.4 g/dL (ref 11.7–15.5)
MCH: 29.4 pg (ref 27.0–33.0)
MCHC: 33.8 g/dL (ref 32.0–36.0)
MCV: 87 fL (ref 80.0–100.0)
MPV: 10.8 fL (ref 7.5–12.5)
PLATELETS: 262 10*3/uL (ref 140–400)
RBC: 4.22 10*6/uL (ref 3.80–5.10)
RDW: 13.6 % (ref 11.0–15.0)
WBC: 9.4 10*3/uL (ref 3.8–10.8)

## 2017-10-26 LAB — COMPLETE METABOLIC PANEL WITH GFR
AG RATIO: 2.1 (calc) (ref 1.0–2.5)
ALKALINE PHOSPHATASE (APISO): 42 U/L (ref 33–130)
ALT: 14 U/L (ref 6–29)
AST: 20 U/L (ref 10–35)
Albumin: 4.2 g/dL (ref 3.6–5.1)
BILIRUBIN TOTAL: 0.6 mg/dL (ref 0.2–1.2)
BUN: 15 mg/dL (ref 7–25)
CHLORIDE: 107 mmol/L (ref 98–110)
CO2: 29 mmol/L (ref 20–32)
Calcium: 9.9 mg/dL (ref 8.6–10.4)
Creat: 0.72 mg/dL (ref 0.60–0.88)
GFR, EST AFRICAN AMERICAN: 91 mL/min/{1.73_m2} (ref >=60)
GFR, Est Non African American: 79 mL/min/{1.73_m2} (ref >=60)
Globulin: 2 g/dL (calc) (ref 1.9–3.7)
Glucose, Bld: 133 mg/dL — ABNORMAL HIGH (ref 65–99)
POTASSIUM: 4.5 mmol/L (ref 3.5–5.3)
Sodium: 142 mmol/L (ref 135–146)
TOTAL PROTEIN: 6.2 g/dL (ref 6.1–8.1)

## 2017-10-26 LAB — LIPID PANEL W/REFLEX DIRECT LDL
CHOLESTEROL: 107 mg/dL (ref <200)
HDL: 43 mg/dL — ABNORMAL LOW (ref >50)
LDL Cholesterol (Calc): 46 mg/dL (calc)
Non-HDL Cholesterol (Calc): 64 mg/dL (calc) (ref <130)
Total CHOL/HDL Ratio: 2.5 (calc) (ref <5.0)
Triglycerides: 101 mg/dL (ref <150)

## 2017-10-26 LAB — PTH, INTACT AND CALCIUM
Calcium: 9.9 mg/dL (ref 8.6–10.4)
PTH: 19 pg/mL (ref 14–64)

## 2017-10-26 LAB — VITAMIN D 25 HYDROXY (VIT D DEFICIENCY, FRACTURES): VIT D 25 HYDROXY: 56 ng/mL (ref 30–100)

## 2017-11-03 DIAGNOSIS — E119 Type 2 diabetes mellitus without complications: Secondary | ICD-10-CM | POA: Diagnosis not present

## 2017-11-03 DIAGNOSIS — H524 Presbyopia: Secondary | ICD-10-CM | POA: Diagnosis not present

## 2017-11-03 LAB — HM DIABETES EYE EXAM

## 2017-11-12 ENCOUNTER — Other Ambulatory Visit: Payer: Self-pay | Admitting: Family Medicine

## 2017-11-20 ENCOUNTER — Other Ambulatory Visit: Payer: Self-pay | Admitting: Family Medicine

## 2017-11-21 ENCOUNTER — Encounter: Payer: Self-pay | Admitting: Family Medicine

## 2017-12-12 ENCOUNTER — Other Ambulatory Visit: Payer: Self-pay | Admitting: Family Medicine

## 2018-01-23 ENCOUNTER — Ambulatory Visit: Payer: Medicare Other | Admitting: Family Medicine

## 2018-02-12 ENCOUNTER — Ambulatory Visit: Payer: Medicare Other

## 2018-04-26 ENCOUNTER — Telehealth: Payer: Self-pay | Admitting: Family Medicine

## 2018-04-26 NOTE — Telephone Encounter (Signed)
Received records request from an internal medicine clinic in [place].  I contacted patient.  She has moved to [place] and will establish medical care there.  We will send records.  Available for help if patient needs any.

## 2018-04-30 DIAGNOSIS — I10 Essential (primary) hypertension: Secondary | ICD-10-CM | POA: Diagnosis not present

## 2018-04-30 DIAGNOSIS — E119 Type 2 diabetes mellitus without complications: Secondary | ICD-10-CM | POA: Diagnosis not present

## 2018-05-02 DIAGNOSIS — Z23 Encounter for immunization: Secondary | ICD-10-CM | POA: Diagnosis not present

## 2018-05-22 DIAGNOSIS — E119 Type 2 diabetes mellitus without complications: Secondary | ICD-10-CM | POA: Diagnosis not present

## 2018-05-22 DIAGNOSIS — Z6820 Body mass index (BMI) 20.0-20.9, adult: Secondary | ICD-10-CM | POA: Diagnosis not present

## 2018-05-22 DIAGNOSIS — I10 Essential (primary) hypertension: Secondary | ICD-10-CM | POA: Diagnosis not present

## 2018-06-28 DIAGNOSIS — R5383 Other fatigue: Secondary | ICD-10-CM | POA: Diagnosis not present

## 2018-06-28 DIAGNOSIS — E785 Hyperlipidemia, unspecified: Secondary | ICD-10-CM | POA: Diagnosis not present

## 2018-06-28 DIAGNOSIS — I10 Essential (primary) hypertension: Secondary | ICD-10-CM | POA: Diagnosis not present

## 2018-06-28 DIAGNOSIS — M545 Low back pain: Secondary | ICD-10-CM | POA: Diagnosis not present

## 2018-06-28 DIAGNOSIS — Z6821 Body mass index (BMI) 21.0-21.9, adult: Secondary | ICD-10-CM | POA: Diagnosis not present

## 2018-06-28 DIAGNOSIS — G4700 Insomnia, unspecified: Secondary | ICD-10-CM | POA: Diagnosis not present

## 2018-07-25 DIAGNOSIS — M5186 Other intervertebral disc disorders, lumbar region: Secondary | ICD-10-CM | POA: Diagnosis not present

## 2018-07-27 DIAGNOSIS — M5136 Other intervertebral disc degeneration, lumbar region: Secondary | ICD-10-CM | POA: Diagnosis not present

## 2018-07-27 DIAGNOSIS — M5126 Other intervertebral disc displacement, lumbar region: Secondary | ICD-10-CM | POA: Diagnosis not present

## 2018-08-02 DIAGNOSIS — M545 Low back pain: Secondary | ICD-10-CM | POA: Diagnosis not present

## 2018-08-02 DIAGNOSIS — S32010A Wedge compression fracture of first lumbar vertebra, initial encounter for closed fracture: Secondary | ICD-10-CM | POA: Diagnosis not present

## 2018-08-02 DIAGNOSIS — M5489 Other dorsalgia: Secondary | ICD-10-CM | POA: Diagnosis not present

## 2018-08-02 DIAGNOSIS — I10 Essential (primary) hypertension: Secondary | ICD-10-CM | POA: Diagnosis not present

## 2018-08-02 DIAGNOSIS — Z743 Need for continuous supervision: Secondary | ICD-10-CM | POA: Diagnosis not present

## 2018-08-02 DIAGNOSIS — S32039A Unspecified fracture of third lumbar vertebra, initial encounter for closed fracture: Secondary | ICD-10-CM | POA: Diagnosis not present

## 2018-08-02 DIAGNOSIS — S32059A Unspecified fracture of fifth lumbar vertebra, initial encounter for closed fracture: Secondary | ICD-10-CM | POA: Diagnosis not present

## 2018-08-02 DIAGNOSIS — S32009A Unspecified fracture of unspecified lumbar vertebra, initial encounter for closed fracture: Secondary | ICD-10-CM | POA: Diagnosis not present

## 2018-08-02 DIAGNOSIS — S32029A Unspecified fracture of second lumbar vertebra, initial encounter for closed fracture: Secondary | ICD-10-CM | POA: Diagnosis not present

## 2018-08-03 DIAGNOSIS — I10 Essential (primary) hypertension: Secondary | ICD-10-CM | POA: Diagnosis not present

## 2018-08-03 DIAGNOSIS — S32010A Wedge compression fracture of first lumbar vertebra, initial encounter for closed fracture: Secondary | ICD-10-CM | POA: Diagnosis not present

## 2018-08-03 DIAGNOSIS — E785 Hyperlipidemia, unspecified: Secondary | ICD-10-CM | POA: Diagnosis not present

## 2018-08-03 DIAGNOSIS — N179 Acute kidney failure, unspecified: Secondary | ICD-10-CM | POA: Diagnosis not present

## 2018-08-03 DIAGNOSIS — E119 Type 2 diabetes mellitus without complications: Secondary | ICD-10-CM | POA: Diagnosis not present

## 2018-08-03 DIAGNOSIS — Z90710 Acquired absence of both cervix and uterus: Secondary | ICD-10-CM | POA: Diagnosis not present

## 2018-08-03 DIAGNOSIS — M5489 Other dorsalgia: Secondary | ICD-10-CM | POA: Diagnosis not present

## 2018-08-03 DIAGNOSIS — R0902 Hypoxemia: Secondary | ICD-10-CM | POA: Diagnosis not present

## 2018-08-03 DIAGNOSIS — Z743 Need for continuous supervision: Secondary | ICD-10-CM | POA: Diagnosis not present

## 2018-08-03 DIAGNOSIS — M4856XA Collapsed vertebra, not elsewhere classified, lumbar region, initial encounter for fracture: Secondary | ICD-10-CM | POA: Diagnosis not present

## 2018-08-04 DIAGNOSIS — N179 Acute kidney failure, unspecified: Secondary | ICD-10-CM | POA: Diagnosis present

## 2018-08-04 DIAGNOSIS — Z9049 Acquired absence of other specified parts of digestive tract: Secondary | ICD-10-CM | POA: Diagnosis not present

## 2018-08-04 DIAGNOSIS — Z602 Problems related to living alone: Secondary | ICD-10-CM | POA: Diagnosis present

## 2018-08-04 DIAGNOSIS — E119 Type 2 diabetes mellitus without complications: Secondary | ICD-10-CM | POA: Diagnosis present

## 2018-08-04 DIAGNOSIS — Z91041 Radiographic dye allergy status: Secondary | ICD-10-CM | POA: Diagnosis not present

## 2018-08-04 DIAGNOSIS — I10 Essential (primary) hypertension: Secondary | ICD-10-CM | POA: Diagnosis present

## 2018-08-04 DIAGNOSIS — M1990 Unspecified osteoarthritis, unspecified site: Secondary | ICD-10-CM | POA: Diagnosis present

## 2018-08-04 DIAGNOSIS — M810 Age-related osteoporosis without current pathological fracture: Secondary | ICD-10-CM | POA: Diagnosis present

## 2018-08-04 DIAGNOSIS — Z91048 Other nonmedicinal substance allergy status: Secondary | ICD-10-CM | POA: Diagnosis not present

## 2018-08-04 DIAGNOSIS — Z90710 Acquired absence of both cervix and uterus: Secondary | ICD-10-CM | POA: Diagnosis not present

## 2018-08-04 DIAGNOSIS — E785 Hyperlipidemia, unspecified: Secondary | ICD-10-CM | POA: Diagnosis present

## 2018-08-04 DIAGNOSIS — M4856XA Collapsed vertebra, not elsewhere classified, lumbar region, initial encounter for fracture: Secondary | ICD-10-CM | POA: Diagnosis present

## 2018-08-04 DIAGNOSIS — G9520 Unspecified cord compression: Secondary | ICD-10-CM | POA: Diagnosis not present

## 2018-08-04 DIAGNOSIS — Z886 Allergy status to analgesic agent status: Secondary | ICD-10-CM | POA: Diagnosis not present

## 2018-08-08 DIAGNOSIS — S32040A Wedge compression fracture of fourth lumbar vertebra, initial encounter for closed fracture: Secondary | ICD-10-CM | POA: Diagnosis not present

## 2018-08-08 DIAGNOSIS — S32030A Wedge compression fracture of third lumbar vertebra, initial encounter for closed fracture: Secondary | ICD-10-CM | POA: Diagnosis not present

## 2018-08-08 DIAGNOSIS — S22080A Wedge compression fracture of T11-T12 vertebra, initial encounter for closed fracture: Secondary | ICD-10-CM | POA: Diagnosis not present

## 2018-08-08 DIAGNOSIS — M545 Low back pain: Secondary | ICD-10-CM | POA: Diagnosis not present

## 2018-08-08 DIAGNOSIS — S32020A Wedge compression fracture of second lumbar vertebra, initial encounter for closed fracture: Secondary | ICD-10-CM | POA: Diagnosis not present

## 2018-08-09 DIAGNOSIS — I10 Essential (primary) hypertension: Secondary | ICD-10-CM | POA: Diagnosis not present

## 2018-08-09 DIAGNOSIS — S32018A Other fracture of first lumbar vertebra, initial encounter for closed fracture: Secondary | ICD-10-CM | POA: Diagnosis not present

## 2018-08-09 DIAGNOSIS — M8008XA Age-related osteoporosis with current pathological fracture, vertebra(e), initial encounter for fracture: Secondary | ICD-10-CM | POA: Diagnosis not present

## 2018-08-09 DIAGNOSIS — Z408 Encounter for other prophylactic surgery: Secondary | ICD-10-CM | POA: Diagnosis not present

## 2018-08-09 DIAGNOSIS — S22088A Other fracture of T11-T12 vertebra, initial encounter for closed fracture: Secondary | ICD-10-CM | POA: Diagnosis not present

## 2018-08-22 DIAGNOSIS — S32040D Wedge compression fracture of fourth lumbar vertebra, subsequent encounter for fracture with routine healing: Secondary | ICD-10-CM | POA: Diagnosis not present

## 2018-08-22 DIAGNOSIS — S22080D Wedge compression fracture of T11-T12 vertebra, subsequent encounter for fracture with routine healing: Secondary | ICD-10-CM | POA: Diagnosis not present

## 2018-08-22 DIAGNOSIS — S32030D Wedge compression fracture of third lumbar vertebra, subsequent encounter for fracture with routine healing: Secondary | ICD-10-CM | POA: Diagnosis not present

## 2018-08-22 DIAGNOSIS — S32020D Wedge compression fracture of second lumbar vertebra, subsequent encounter for fracture with routine healing: Secondary | ICD-10-CM | POA: Diagnosis not present

## 2018-08-23 DIAGNOSIS — E119 Type 2 diabetes mellitus without complications: Secondary | ICD-10-CM | POA: Diagnosis not present

## 2018-08-23 DIAGNOSIS — M810 Age-related osteoporosis without current pathological fracture: Secondary | ICD-10-CM | POA: Diagnosis not present

## 2018-08-23 DIAGNOSIS — M109 Gout, unspecified: Secondary | ICD-10-CM | POA: Diagnosis not present

## 2018-08-23 DIAGNOSIS — Z794 Long term (current) use of insulin: Secondary | ICD-10-CM | POA: Diagnosis not present

## 2018-08-23 DIAGNOSIS — Z4789 Encounter for other orthopedic aftercare: Secondary | ICD-10-CM | POA: Diagnosis not present

## 2018-08-23 DIAGNOSIS — M4854XD Collapsed vertebra, not elsewhere classified, thoracic region, subsequent encounter for fracture with routine healing: Secondary | ICD-10-CM | POA: Diagnosis not present

## 2018-08-23 DIAGNOSIS — M4856XD Collapsed vertebra, not elsewhere classified, lumbar region, subsequent encounter for fracture with routine healing: Secondary | ICD-10-CM | POA: Diagnosis not present

## 2018-08-29 DIAGNOSIS — E119 Type 2 diabetes mellitus without complications: Secondary | ICD-10-CM | POA: Diagnosis not present

## 2018-08-29 DIAGNOSIS — M4856XD Collapsed vertebra, not elsewhere classified, lumbar region, subsequent encounter for fracture with routine healing: Secondary | ICD-10-CM | POA: Diagnosis not present

## 2018-08-29 DIAGNOSIS — Z794 Long term (current) use of insulin: Secondary | ICD-10-CM | POA: Diagnosis not present

## 2018-08-29 DIAGNOSIS — Z4789 Encounter for other orthopedic aftercare: Secondary | ICD-10-CM | POA: Diagnosis not present

## 2018-08-29 DIAGNOSIS — M4854XD Collapsed vertebra, not elsewhere classified, thoracic region, subsequent encounter for fracture with routine healing: Secondary | ICD-10-CM | POA: Diagnosis not present

## 2018-08-29 DIAGNOSIS — M109 Gout, unspecified: Secondary | ICD-10-CM | POA: Diagnosis not present

## 2018-09-04 DIAGNOSIS — Z4789 Encounter for other orthopedic aftercare: Secondary | ICD-10-CM | POA: Diagnosis not present

## 2018-09-04 DIAGNOSIS — M109 Gout, unspecified: Secondary | ICD-10-CM | POA: Diagnosis not present

## 2018-09-04 DIAGNOSIS — M4854XD Collapsed vertebra, not elsewhere classified, thoracic region, subsequent encounter for fracture with routine healing: Secondary | ICD-10-CM | POA: Diagnosis not present

## 2018-09-04 DIAGNOSIS — E119 Type 2 diabetes mellitus without complications: Secondary | ICD-10-CM | POA: Diagnosis not present

## 2018-09-04 DIAGNOSIS — M4856XD Collapsed vertebra, not elsewhere classified, lumbar region, subsequent encounter for fracture with routine healing: Secondary | ICD-10-CM | POA: Diagnosis not present

## 2018-09-04 DIAGNOSIS — Z794 Long term (current) use of insulin: Secondary | ICD-10-CM | POA: Diagnosis not present

## 2018-09-11 DIAGNOSIS — Z794 Long term (current) use of insulin: Secondary | ICD-10-CM | POA: Diagnosis not present

## 2018-09-11 DIAGNOSIS — E119 Type 2 diabetes mellitus without complications: Secondary | ICD-10-CM | POA: Diagnosis not present

## 2018-09-11 DIAGNOSIS — M4854XD Collapsed vertebra, not elsewhere classified, thoracic region, subsequent encounter for fracture with routine healing: Secondary | ICD-10-CM | POA: Diagnosis not present

## 2018-09-11 DIAGNOSIS — M109 Gout, unspecified: Secondary | ICD-10-CM | POA: Diagnosis not present

## 2018-09-11 DIAGNOSIS — Z4789 Encounter for other orthopedic aftercare: Secondary | ICD-10-CM | POA: Diagnosis not present

## 2018-09-11 DIAGNOSIS — M4856XD Collapsed vertebra, not elsewhere classified, lumbar region, subsequent encounter for fracture with routine healing: Secondary | ICD-10-CM | POA: Diagnosis not present

## 2018-09-13 DIAGNOSIS — M5136 Other intervertebral disc degeneration, lumbar region: Secondary | ICD-10-CM | POA: Diagnosis not present

## 2018-09-13 DIAGNOSIS — M5126 Other intervertebral disc displacement, lumbar region: Secondary | ICD-10-CM | POA: Diagnosis not present

## 2018-09-18 DIAGNOSIS — M545 Low back pain: Secondary | ICD-10-CM | POA: Diagnosis not present

## 2018-09-18 DIAGNOSIS — M546 Pain in thoracic spine: Secondary | ICD-10-CM | POA: Diagnosis not present

## 2018-09-18 DIAGNOSIS — M47816 Spondylosis without myelopathy or radiculopathy, lumbar region: Secondary | ICD-10-CM | POA: Diagnosis not present

## 2018-09-18 DIAGNOSIS — M5136 Other intervertebral disc degeneration, lumbar region: Secondary | ICD-10-CM | POA: Diagnosis not present

## 2018-09-19 DIAGNOSIS — M8008XA Age-related osteoporosis with current pathological fracture, vertebra(e), initial encounter for fracture: Secondary | ICD-10-CM | POA: Diagnosis not present

## 2018-09-19 DIAGNOSIS — M8088XA Other osteoporosis with current pathological fracture, vertebra(e), initial encounter for fracture: Secondary | ICD-10-CM | POA: Diagnosis not present

## 2018-09-20 DIAGNOSIS — M4856XD Collapsed vertebra, not elsewhere classified, lumbar region, subsequent encounter for fracture with routine healing: Secondary | ICD-10-CM | POA: Diagnosis not present

## 2018-09-20 DIAGNOSIS — Z794 Long term (current) use of insulin: Secondary | ICD-10-CM | POA: Diagnosis not present

## 2018-09-20 DIAGNOSIS — E119 Type 2 diabetes mellitus without complications: Secondary | ICD-10-CM | POA: Diagnosis not present

## 2018-09-20 DIAGNOSIS — M109 Gout, unspecified: Secondary | ICD-10-CM | POA: Diagnosis not present

## 2018-09-20 DIAGNOSIS — Z4789 Encounter for other orthopedic aftercare: Secondary | ICD-10-CM | POA: Diagnosis not present

## 2018-09-20 DIAGNOSIS — M4854XD Collapsed vertebra, not elsewhere classified, thoracic region, subsequent encounter for fracture with routine healing: Secondary | ICD-10-CM | POA: Diagnosis not present

## 2018-09-22 DIAGNOSIS — M4854XD Collapsed vertebra, not elsewhere classified, thoracic region, subsequent encounter for fracture with routine healing: Secondary | ICD-10-CM | POA: Diagnosis not present

## 2018-09-22 DIAGNOSIS — E119 Type 2 diabetes mellitus without complications: Secondary | ICD-10-CM | POA: Diagnosis not present

## 2018-09-22 DIAGNOSIS — Z4789 Encounter for other orthopedic aftercare: Secondary | ICD-10-CM | POA: Diagnosis not present

## 2018-09-22 DIAGNOSIS — M109 Gout, unspecified: Secondary | ICD-10-CM | POA: Diagnosis not present

## 2018-09-22 DIAGNOSIS — M4856XD Collapsed vertebra, not elsewhere classified, lumbar region, subsequent encounter for fracture with routine healing: Secondary | ICD-10-CM | POA: Diagnosis not present

## 2018-09-22 DIAGNOSIS — Z794 Long term (current) use of insulin: Secondary | ICD-10-CM | POA: Diagnosis not present

## 2018-10-02 DIAGNOSIS — M5136 Other intervertebral disc degeneration, lumbar region: Secondary | ICD-10-CM | POA: Diagnosis not present

## 2018-10-02 DIAGNOSIS — M47816 Spondylosis without myelopathy or radiculopathy, lumbar region: Secondary | ICD-10-CM | POA: Diagnosis not present

## 2018-10-02 DIAGNOSIS — M5137 Other intervertebral disc degeneration, lumbosacral region: Secondary | ICD-10-CM | POA: Diagnosis not present

## 2018-10-02 DIAGNOSIS — M5135 Other intervertebral disc degeneration, thoracolumbar region: Secondary | ICD-10-CM | POA: Diagnosis not present

## 2018-10-03 DIAGNOSIS — Z4789 Encounter for other orthopedic aftercare: Secondary | ICD-10-CM | POA: Diagnosis not present

## 2018-10-03 DIAGNOSIS — M4856XD Collapsed vertebra, not elsewhere classified, lumbar region, subsequent encounter for fracture with routine healing: Secondary | ICD-10-CM | POA: Diagnosis not present

## 2018-10-03 DIAGNOSIS — E119 Type 2 diabetes mellitus without complications: Secondary | ICD-10-CM | POA: Diagnosis not present

## 2018-10-03 DIAGNOSIS — M109 Gout, unspecified: Secondary | ICD-10-CM | POA: Diagnosis not present

## 2018-10-03 DIAGNOSIS — M4854XD Collapsed vertebra, not elsewhere classified, thoracic region, subsequent encounter for fracture with routine healing: Secondary | ICD-10-CM | POA: Diagnosis not present

## 2018-10-03 DIAGNOSIS — Z794 Long term (current) use of insulin: Secondary | ICD-10-CM | POA: Diagnosis not present

## 2018-10-18 DIAGNOSIS — Z4789 Encounter for other orthopedic aftercare: Secondary | ICD-10-CM | POA: Diagnosis not present

## 2018-10-18 DIAGNOSIS — M109 Gout, unspecified: Secondary | ICD-10-CM | POA: Diagnosis not present

## 2018-10-18 DIAGNOSIS — M4856XD Collapsed vertebra, not elsewhere classified, lumbar region, subsequent encounter for fracture with routine healing: Secondary | ICD-10-CM | POA: Diagnosis not present

## 2018-10-18 DIAGNOSIS — E119 Type 2 diabetes mellitus without complications: Secondary | ICD-10-CM | POA: Diagnosis not present

## 2018-10-18 DIAGNOSIS — Z794 Long term (current) use of insulin: Secondary | ICD-10-CM | POA: Diagnosis not present

## 2018-10-18 DIAGNOSIS — M4854XD Collapsed vertebra, not elsewhere classified, thoracic region, subsequent encounter for fracture with routine healing: Secondary | ICD-10-CM | POA: Diagnosis not present

## 2019-02-26 DIAGNOSIS — M810 Age-related osteoporosis without current pathological fracture: Secondary | ICD-10-CM | POA: Diagnosis not present

## 2019-03-05 DIAGNOSIS — E119 Type 2 diabetes mellitus without complications: Secondary | ICD-10-CM | POA: Diagnosis not present

## 2019-03-05 DIAGNOSIS — E782 Mixed hyperlipidemia: Secondary | ICD-10-CM | POA: Diagnosis not present

## 2019-03-05 DIAGNOSIS — I10 Essential (primary) hypertension: Secondary | ICD-10-CM | POA: Diagnosis not present

## 2019-03-05 DIAGNOSIS — K219 Gastro-esophageal reflux disease without esophagitis: Secondary | ICD-10-CM | POA: Diagnosis not present

## 2019-03-05 DIAGNOSIS — Z6820 Body mass index (BMI) 20.0-20.9, adult: Secondary | ICD-10-CM | POA: Diagnosis not present

## 2019-03-05 DIAGNOSIS — M810 Age-related osteoporosis without current pathological fracture: Secondary | ICD-10-CM | POA: Diagnosis not present

## 2019-03-06 DIAGNOSIS — I10 Essential (primary) hypertension: Secondary | ICD-10-CM | POA: Diagnosis not present

## 2019-03-06 DIAGNOSIS — E119 Type 2 diabetes mellitus without complications: Secondary | ICD-10-CM | POA: Diagnosis not present

## 2019-03-06 DIAGNOSIS — E782 Mixed hyperlipidemia: Secondary | ICD-10-CM | POA: Diagnosis not present

## 2019-03-06 DIAGNOSIS — Z79899 Other long term (current) drug therapy: Secondary | ICD-10-CM | POA: Diagnosis not present

## 2019-04-02 DIAGNOSIS — K219 Gastro-esophageal reflux disease without esophagitis: Secondary | ICD-10-CM | POA: Diagnosis not present

## 2019-04-02 DIAGNOSIS — E119 Type 2 diabetes mellitus without complications: Secondary | ICD-10-CM | POA: Diagnosis not present

## 2019-04-02 DIAGNOSIS — E782 Mixed hyperlipidemia: Secondary | ICD-10-CM | POA: Diagnosis not present

## 2019-04-02 DIAGNOSIS — I10 Essential (primary) hypertension: Secondary | ICD-10-CM | POA: Diagnosis not present

## 2019-04-02 DIAGNOSIS — M810 Age-related osteoporosis without current pathological fracture: Secondary | ICD-10-CM | POA: Diagnosis not present

## 2019-04-02 DIAGNOSIS — L989 Disorder of the skin and subcutaneous tissue, unspecified: Secondary | ICD-10-CM | POA: Diagnosis not present

## 2019-04-02 DIAGNOSIS — Z1211 Encounter for screening for malignant neoplasm of colon: Secondary | ICD-10-CM | POA: Diagnosis not present

## 2019-04-03 DIAGNOSIS — L570 Actinic keratosis: Secondary | ICD-10-CM | POA: Diagnosis not present

## 2019-04-03 DIAGNOSIS — D485 Neoplasm of uncertain behavior of skin: Secondary | ICD-10-CM | POA: Diagnosis not present

## 2019-04-03 DIAGNOSIS — L282 Other prurigo: Secondary | ICD-10-CM | POA: Diagnosis not present

## 2019-04-03 DIAGNOSIS — R233 Spontaneous ecchymoses: Secondary | ICD-10-CM | POA: Diagnosis not present

## 2019-04-03 DIAGNOSIS — L988 Other specified disorders of the skin and subcutaneous tissue: Secondary | ICD-10-CM | POA: Diagnosis not present

## 2019-04-17 DIAGNOSIS — L570 Actinic keratosis: Secondary | ICD-10-CM | POA: Diagnosis not present

## 2019-05-29 DIAGNOSIS — L570 Actinic keratosis: Secondary | ICD-10-CM | POA: Diagnosis not present

## 2019-08-20 DIAGNOSIS — M47816 Spondylosis without myelopathy or radiculopathy, lumbar region: Secondary | ICD-10-CM | POA: Diagnosis not present

## 2019-08-20 DIAGNOSIS — M5135 Other intervertebral disc degeneration, thoracolumbar region: Secondary | ICD-10-CM | POA: Diagnosis not present

## 2019-08-20 DIAGNOSIS — M5137 Other intervertebral disc degeneration, lumbosacral region: Secondary | ICD-10-CM | POA: Diagnosis not present

## 2019-08-20 DIAGNOSIS — M5136 Other intervertebral disc degeneration, lumbar region: Secondary | ICD-10-CM | POA: Diagnosis not present

## 2019-08-27 DIAGNOSIS — M810 Age-related osteoporosis without current pathological fracture: Secondary | ICD-10-CM | POA: Diagnosis not present

## 2019-08-27 DIAGNOSIS — M4854XA Collapsed vertebra, not elsewhere classified, thoracic region, initial encounter for fracture: Secondary | ICD-10-CM | POA: Diagnosis not present

## 2019-08-27 DIAGNOSIS — Z981 Arthrodesis status: Secondary | ICD-10-CM | POA: Diagnosis not present

## 2019-08-27 DIAGNOSIS — M4804 Spinal stenosis, thoracic region: Secondary | ICD-10-CM | POA: Diagnosis not present

## 2019-08-27 DIAGNOSIS — M5124 Other intervertebral disc displacement, thoracic region: Secondary | ICD-10-CM | POA: Diagnosis not present

## 2019-08-30 DIAGNOSIS — M810 Age-related osteoporosis without current pathological fracture: Secondary | ICD-10-CM | POA: Diagnosis not present

## 2019-08-30 DIAGNOSIS — M5135 Other intervertebral disc degeneration, thoracolumbar region: Secondary | ICD-10-CM | POA: Diagnosis not present

## 2019-08-30 DIAGNOSIS — M48061 Spinal stenosis, lumbar region without neurogenic claudication: Secondary | ICD-10-CM | POA: Diagnosis not present

## 2019-08-30 DIAGNOSIS — M5136 Other intervertebral disc degeneration, lumbar region: Secondary | ICD-10-CM | POA: Diagnosis not present

## 2019-08-30 DIAGNOSIS — M5137 Other intervertebral disc degeneration, lumbosacral region: Secondary | ICD-10-CM | POA: Diagnosis not present

## 2019-09-04 DIAGNOSIS — M545 Low back pain: Secondary | ICD-10-CM | POA: Diagnosis not present

## 2019-09-18 DIAGNOSIS — M545 Low back pain: Secondary | ICD-10-CM | POA: Diagnosis not present

## 2019-10-09 DIAGNOSIS — M5135 Other intervertebral disc degeneration, thoracolumbar region: Secondary | ICD-10-CM | POA: Diagnosis not present

## 2019-10-09 DIAGNOSIS — M48062 Spinal stenosis, lumbar region with neurogenic claudication: Secondary | ICD-10-CM | POA: Diagnosis not present

## 2019-10-09 DIAGNOSIS — M5137 Other intervertebral disc degeneration, lumbosacral region: Secondary | ICD-10-CM | POA: Diagnosis not present

## 2019-10-09 DIAGNOSIS — M5136 Other intervertebral disc degeneration, lumbar region: Secondary | ICD-10-CM | POA: Diagnosis not present

## 2019-10-11 DIAGNOSIS — M5416 Radiculopathy, lumbar region: Secondary | ICD-10-CM | POA: Diagnosis not present
